# Patient Record
Sex: Female | Born: 1985 | Race: White | Hispanic: No | Marital: Single | Smoking: Current every day smoker
Health system: Southern US, Community
[De-identification: ages and names within clinical notes are randomized; demographics above are authoritative.]

## PROBLEM LIST (undated history)

## (undated) DIAGNOSIS — R87619 Unspecified abnormal cytological findings in specimens from cervix uteri: Secondary | ICD-10-CM

## (undated) HISTORY — PX: MOLE REMOVAL: SHX2046

## (undated) HISTORY — DX: Unspecified abnormal cytological findings in specimens from cervix uteri: R87.619

## (undated) HISTORY — PX: LEEP: SHX91

---
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
## 2003-05-17 ENCOUNTER — Other Ambulatory Visit: Admission: RE | Admit: 2003-05-17 | Discharge: 2003-05-17 | Payer: Self-pay | Admitting: Gynecology

## 2004-05-15 ENCOUNTER — Other Ambulatory Visit: Admission: RE | Admit: 2004-05-15 | Discharge: 2004-05-15 | Payer: Self-pay | Admitting: Gynecology

## 2005-06-04 ENCOUNTER — Other Ambulatory Visit: Admission: RE | Admit: 2005-06-04 | Discharge: 2005-06-04 | Payer: Self-pay | Admitting: Gynecology

## 2006-07-13 ENCOUNTER — Other Ambulatory Visit: Admission: RE | Admit: 2006-07-13 | Discharge: 2006-07-13 | Payer: Self-pay | Admitting: *Deleted

## 2007-02-02 ENCOUNTER — Other Ambulatory Visit: Admission: RE | Admit: 2007-02-02 | Discharge: 2007-02-02 | Payer: Self-pay | Admitting: *Deleted

## 2011-07-10 ENCOUNTER — Emergency Department (HOSPITAL_COMMUNITY): Payer: 59

## 2011-07-10 ENCOUNTER — Emergency Department (HOSPITAL_COMMUNITY)
Admission: EM | Admit: 2011-07-10 | Discharge: 2011-07-11 | Disposition: A | Discharge status: Home / Self Care | Payer: 59 | Attending: Emergency Medicine | Admitting: Emergency Medicine

## 2011-07-10 DIAGNOSIS — Z79899 Other long term (current) drug therapy: Secondary | ICD-10-CM | POA: Insufficient documentation

## 2011-07-10 DIAGNOSIS — N949 Unspecified condition associated with female genital organs and menstrual cycle: Secondary | ICD-10-CM | POA: Insufficient documentation

## 2011-07-10 DIAGNOSIS — Z87891 Personal history of nicotine dependence: Secondary | ICD-10-CM | POA: Insufficient documentation

## 2011-07-10 DIAGNOSIS — N12 Tubulo-interstitial nephritis, not specified as acute or chronic: Secondary | ICD-10-CM | POA: Insufficient documentation

## 2011-07-10 DIAGNOSIS — R110 Nausea: Secondary | ICD-10-CM | POA: Insufficient documentation

## 2011-07-10 DIAGNOSIS — D259 Leiomyoma of uterus, unspecified: Secondary | ICD-10-CM | POA: Insufficient documentation

## 2011-07-10 LAB — URINE MICROSCOPIC-ADD ON

## 2011-07-10 LAB — DIFFERENTIAL
Eosinophils Relative: 0 % (ref 0–5)
Lymphocytes Relative: 15 % (ref 12–46)
Lymphs Abs: 1.1 10*3/uL (ref 0.7–4.0)
Monocytes Absolute: 0.7 10*3/uL (ref 0.1–1.0)

## 2011-07-10 LAB — URINALYSIS, ROUTINE W REFLEX MICROSCOPIC
Ketones, ur: 15 mg/dL — AB
Nitrite: POSITIVE — AB
Protein, ur: NEGATIVE mg/dL

## 2011-07-10 LAB — CBC
HCT: 37.2 % (ref 36.0–46.0)
MCV: 94.9 fL (ref 78.0–100.0)
RDW: 11.3 % — ABNORMAL LOW (ref 11.5–15.5)
WBC: 7.4 10*3/uL (ref 4.0–10.5)

## 2011-07-10 LAB — WET PREP, GENITAL: Clue Cells Wet Prep HPF POC: NONE SEEN

## 2011-07-11 LAB — COMPREHENSIVE METABOLIC PANEL
ALT: 19 U/L (ref 0–35)
AST: 21 U/L (ref 0–37)
Albumin: 3.7 g/dL (ref 3.5–5.2)
Alkaline Phosphatase: 75 U/L (ref 39–117)
BUN: 8 mg/dL (ref 6–23)
Chloride: 101 mEq/L (ref 96–112)
Potassium: 3.2 mEq/L — ABNORMAL LOW (ref 3.5–5.1)
Sodium: 138 mEq/L (ref 135–145)
Total Bilirubin: 0.2 mg/dL — ABNORMAL LOW (ref 0.3–1.2)

## 2011-07-17 LAB — CULTURE, BLOOD (ROUTINE X 2)
Culture  Setup Time: 201209220529
Culture: NO GROWTH

## 2012-07-05 ENCOUNTER — Ambulatory Visit: Payer: Self-pay | Admitting: Internal Medicine

## 2012-07-05 ENCOUNTER — Telehealth: Payer: Self-pay

## 2012-07-05 VITALS — BP 120/76 | HR 84 | Temp 98.2°F | Resp 16 | Ht 66.5 in | Wt 133.0 lb

## 2012-07-05 DIAGNOSIS — Z23 Encounter for immunization: Secondary | ICD-10-CM

## 2012-07-05 NOTE — Telephone Encounter (Signed)
PATIENT HAD TDAP TODAY AND WANTED INFORMATION FAXED TO SCHOOL BUT SHE DIDN'T HAVE THE FAX #.  THAT # FOR [employer] IS [phone]  HER CELL PHONE IS [phone]

## 2012-07-05 NOTE — Progress Notes (Signed)
  Subjective:    Patient ID: Alicia Lucas, female    DOB: [birth year], 26 y.o.   MRN: [id number]  HPI Needs Tdap No other issues   Review of Systems     Objective:   Physical Exam healthy       Assessment & Plan:  Tdap

## 2012-07-05 NOTE — Telephone Encounter (Signed)
Called pt and informed that her T-dap immunization has been faxed and attn: to [facility].

## 2013-06-16 IMAGING — US US TRANSVAGINAL NON-OB
1 series · 13 of 25 positions shown · non-contrast
Comparison: None.

CLINICAL DATA: Dysuria

TRANSABDOMINAL AND TRANSVAGINAL ULTRASOUND OF PELVIS
TECHNIQUE: Both transabdominal and transvaginal ultrasound
examinations of the pelvis were performed.  Transabdominal
technique was performed for global imaging of the pelvis including
uterus, ovaries, adnexal regions, and pelvic cul-de-sac.
It was necessary to proceed with endovaginal exam following the
transabdominal exam to visualize the uterus, endometrium, ovaries
and adnexa.

[Series 1: us transvaginal non-ob · 0.23mm/px · 13 of 40 slices shown]
[im 1/40]
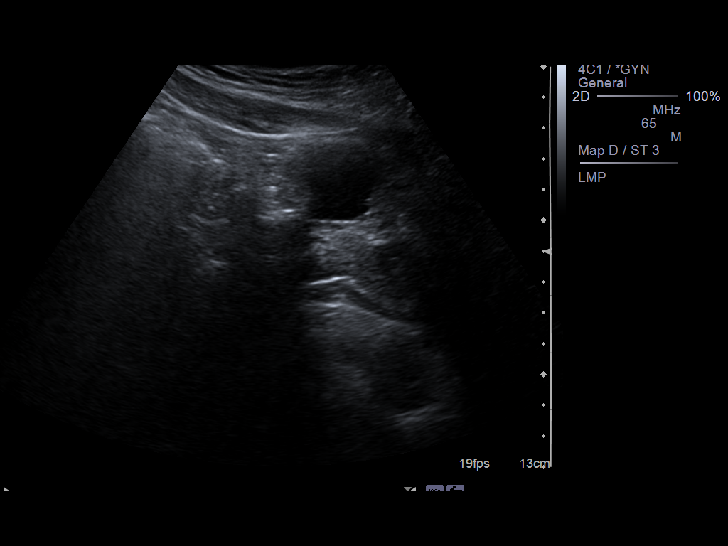
[im 4/40]
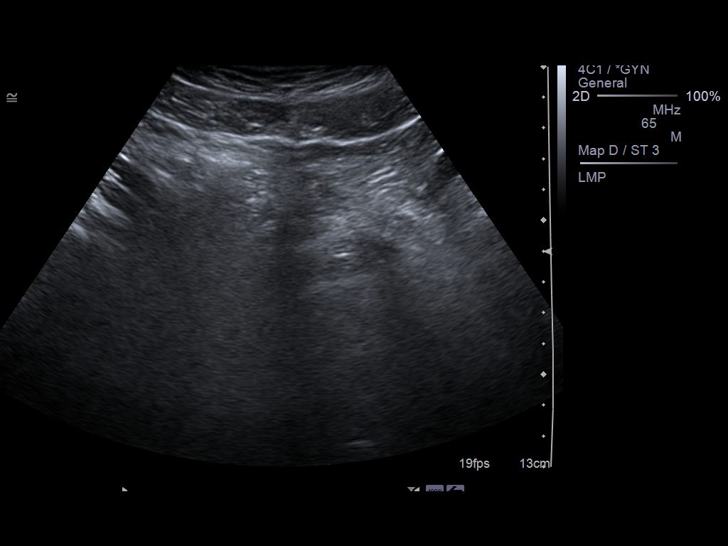
[im 7/40]
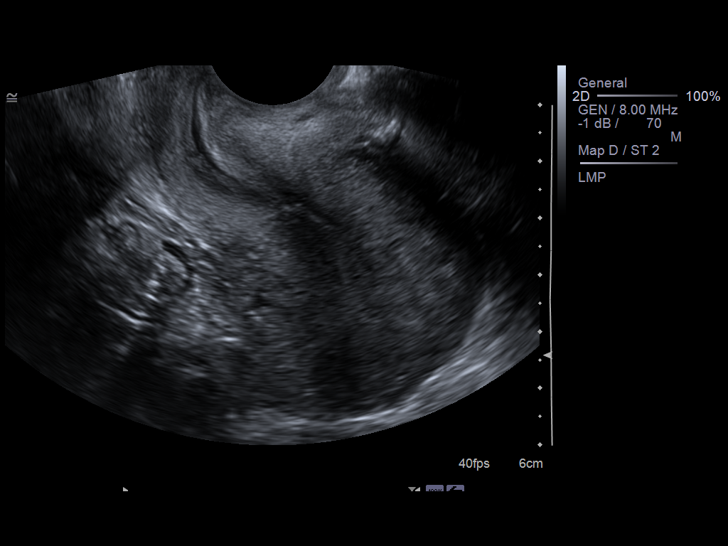
[im 10/40]
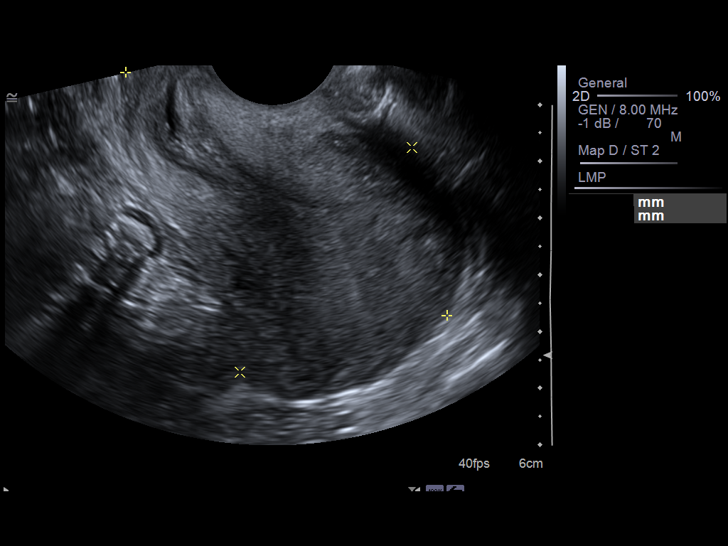
[im 14/40]
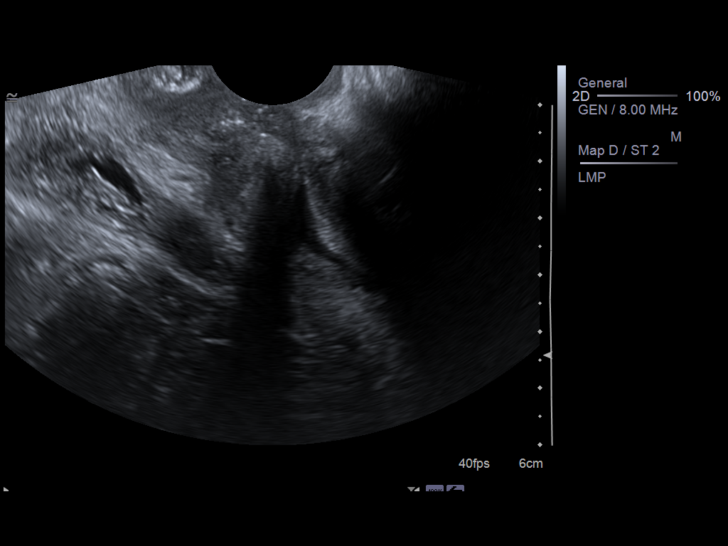
[im 17/40]
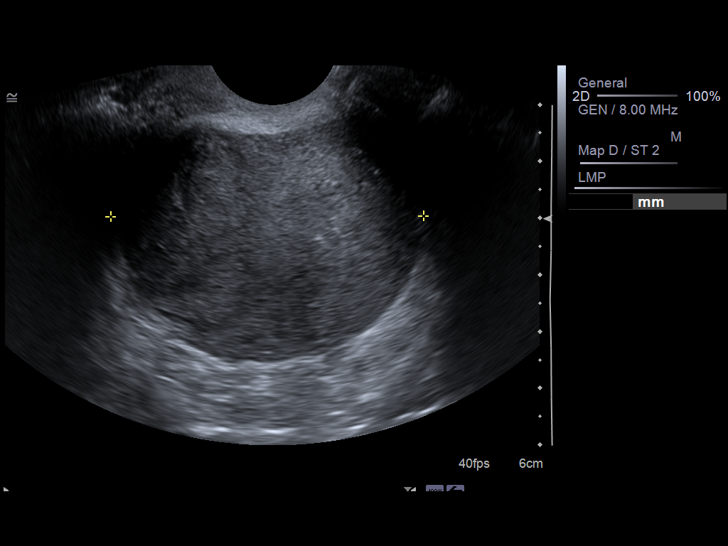
[im 20/40]
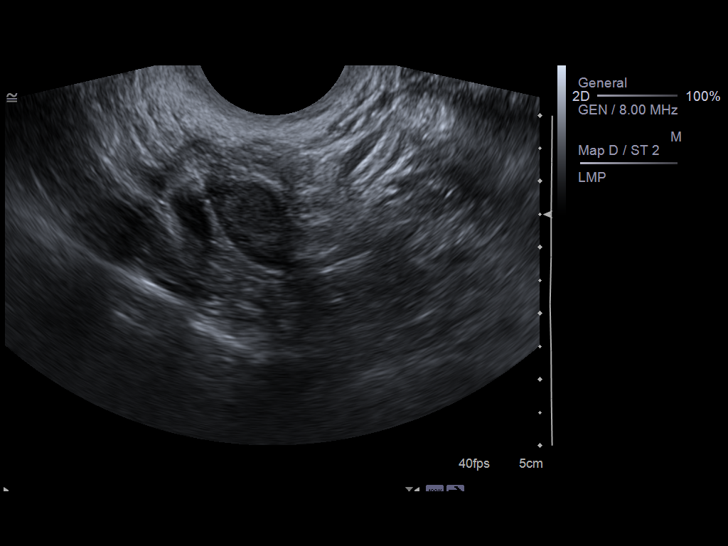
[im 23/40]
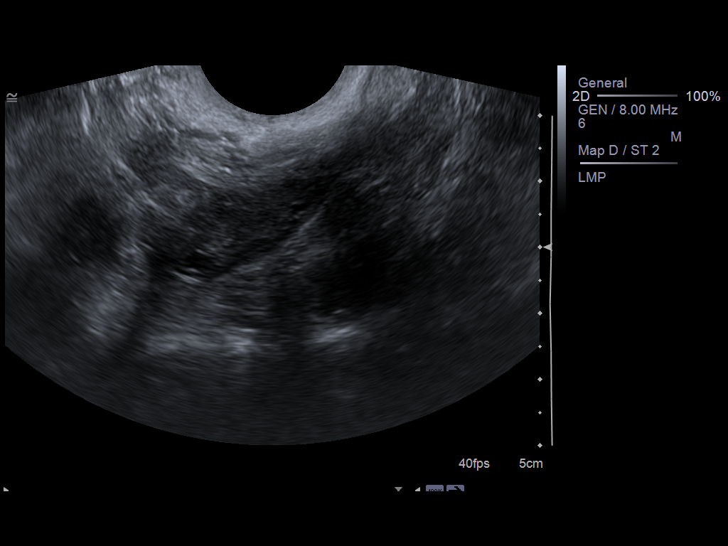
[im 27/40]
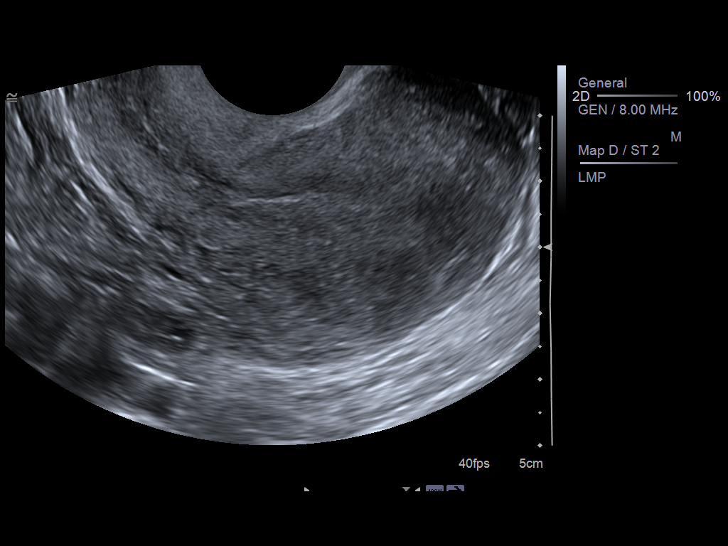
[im 30/40]
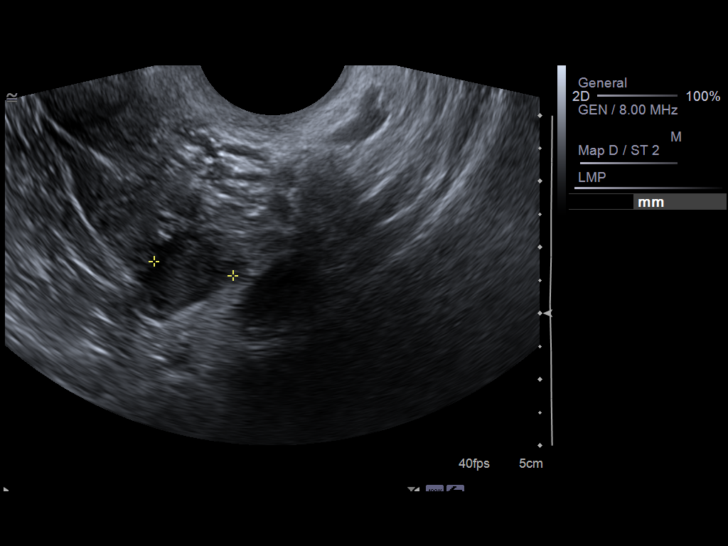
[im 33/40]
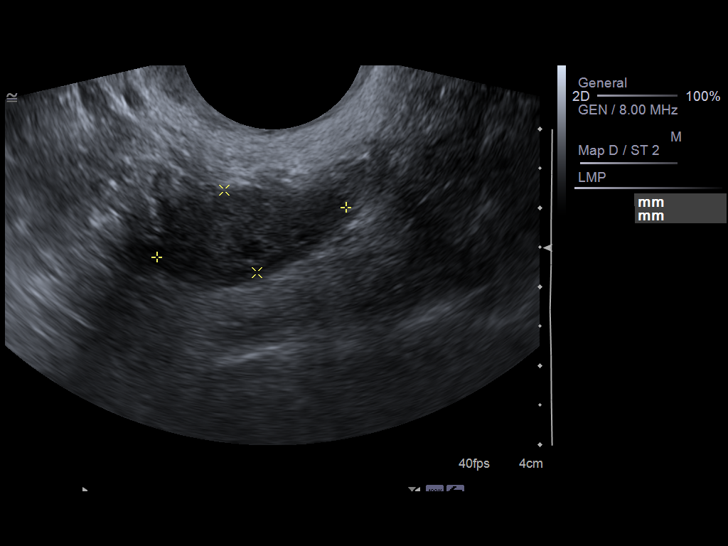
[im 36/40]
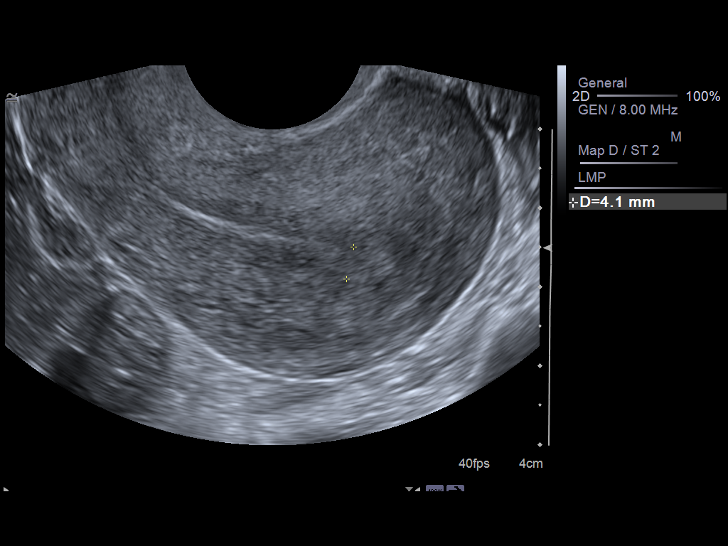
[im 40/40]
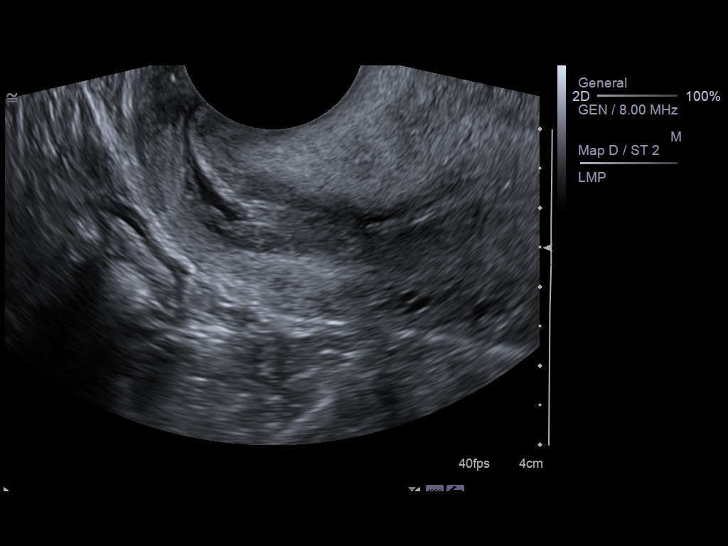

[13 of 25 positions shown; findings below may reference images not displayed]

FINDINGS: Uterus:  The uterus measures 7.1 x 5.0 x 5.5 cm.  Retroflexed in
orientation containing fluid within the lower uterine segment and
cervix.  Within the posterior myometrium there is a fibroid
measuring 6.1 x 5.4 x 6.1 mm. Within the lower uterine segment
there is a focal ovoid filling defect which measures 6.8 x 1.1 x
5.8 mm.

Endometrium: Normal in thickness and appearance measuring up to
mm.

Right ovary: Right ovary appears normal measuring 3.0 x 1.2 x
cm.

Left ovary: The left ovary is normal measuring 2.5 x 1.1 x 1.4 cm.

Other Findings:  No free fluid
IMPRESSION: 1.  Focal filling defect within the lower uterine segment.  This
may represent debris and/or blood clot. Alternatively this could
represent a small submucosal fibroid or polyp.  Recommend follow-up
pelvic sonogram within 6-12 weeks and to ensure resolution.  If
this persists then further evaluation with sonohystogram is
recommended.
2.  Small posterior fibroid.

## 2014-04-15 ENCOUNTER — Ambulatory Visit (INDEPENDENT_AMBULATORY_CARE_PROVIDER_SITE_OTHER): Payer: BC Managed Care – PPO | Admitting: Emergency Medicine

## 2014-04-15 VITALS — BP 100/76 | HR 100 | Temp 98.0°F | Resp 20 | Ht 66.5 in | Wt 138.2 lb

## 2014-04-15 DIAGNOSIS — A09 Infectious gastroenteritis and colitis, unspecified: Secondary | ICD-10-CM

## 2014-04-15 DIAGNOSIS — R197 Diarrhea, unspecified: Secondary | ICD-10-CM

## 2014-04-15 MED ORDER — ONDANSETRON 8 MG PO TBDP: 8.0000 mg | ORAL_TABLET | Freq: Four times a day (QID) | ORAL | Status: DC | PRN

## 2014-04-15 MED ORDER — CIPROFLOXACIN HCL 500 MG PO TABS: 500.0000 mg | ORAL_TABLET | Freq: Two times a day (BID) | ORAL | Status: DC

## 2014-04-15 MED ORDER — LOPERAMIDE HCL 2 MG PO TABS: ORAL_TABLET | ORAL | Status: DC

## 2014-04-15 NOTE — Progress Notes (Signed)
[facility] [address] [place] [address] [phone]  Date:  04/15/2014   Name:  Alicia Lucas   DOB:  05/12/1986   MRN:  [id number]  PCP:  No PCP Per Patient    Chief Complaint: Abdominal Pain   History of Present Illness:  Sintia Mckissic is a 28 y.o. very pleasant female patient who presents with the following:  Ill after returning last night from [place] on vacation.  Had profuse watery diarrhea and abdominal cramping pain all night long.  No fever but chilled.  Nauseated and scattered vomiting.  No ill contacts.  The patient has no complaint of blood, mucous, or pus in her stools. No improvement with over the counter medications or other home remedies. Denies other complaint or health concern today.   There are no active problems to display for this patient.   No past medical history on file.  No past surgical history on file.  History  Substance Use Topics  . Smoking status: Current Some Day Smoker -- 0.50 packs/day    Types: Cigarettes  . Smokeless tobacco: Not on file  . Alcohol Use: Yes     Comment: social use    No family history on file.  No Known Allergies  Medication list has been reviewed and updated.  No current outpatient prescriptions on file prior to visit.   No current facility-administered medications on file prior to visit.    Review of Systems:  As per HPI, otherwise negative.    Physical Examination: Filed Vitals:   04/15/14 1102  BP: 100/76  Pulse: 100  Temp: 98 F (36.7 C)  Resp: 20   Filed Vitals:   04/15/14 1102  Height: 5' 6.5" (1.689 m)  Weight: 138 lb 3.2 oz (62.687 kg)   Body mass index is 21.97 kg/(m^2). Ideal Body Weight: Weight in (lb) to have BMI = 25: 156.9  GEN: WDWN, NAD, Non-toxic, A & O x 3 HEENT: Atraumatic, Normocephalic. Neck supple. No masses, No LAD.  Tongue dry Ears and Nose: No external deformity. CV: RRR, No M/G/R. No JVD. No thrill. No extra heart sounds. PULM: CTA  B, no wheezes, crackles, rhonchi. No retractions. No resp. distress. No accessory muscle use. ABD: S, left abdominal tenderness, ND, +BS. No rebound. No HSM. EXTR: No c/c/e NEURO Normal gait.  PSYCH: Normally interactive. Conversant. Not depressed or anxious appearing.  Calm demeanor.    Assessment and Plan: travellers diarrhea cipro immodium zofran   Signed,  Ellison Carwin, MD

## 2014-04-15 NOTE — Patient Instructions (Signed)

## 2016-01-17 ENCOUNTER — Ambulatory Visit (INDEPENDENT_AMBULATORY_CARE_PROVIDER_SITE_OTHER): Payer: BLUE CROSS/BLUE SHIELD | Admitting: Podiatry

## 2016-01-17 ENCOUNTER — Encounter: Payer: Self-pay | Admitting: Podiatry

## 2016-01-17 VITALS — BP 131/72 | HR 75 | Resp 12

## 2016-01-17 DIAGNOSIS — B351 Tinea unguium: Secondary | ICD-10-CM | POA: Diagnosis not present

## 2016-01-17 LAB — CBC WITH DIFFERENTIAL/PLATELET
BASOS ABS: 0.1 10*3/uL (ref 0.0–0.1)
Basophils Relative: 1 % (ref 0–1)
EOS PCT: 4 % (ref 0–5)
Eosinophils Absolute: 0.2 10*3/uL (ref 0.0–0.7)
HCT: 40.1 % (ref 36.0–46.0)
HEMOGLOBIN: 13.3 g/dL (ref 12.0–15.0)
LYMPHS ABS: 1.4 10*3/uL (ref 0.7–4.0)
LYMPHS PCT: 27 % (ref 12–46)
MCH: 32.6 pg (ref 26.0–34.0)
MCHC: 33.2 g/dL (ref 30.0–36.0)
MCV: 98.3 fL (ref 78.0–100.0)
MONO ABS: 0.7 10*3/uL (ref 0.1–1.0)
MPV: 9.6 fL (ref 8.6–12.4)
Monocytes Relative: 14 % — ABNORMAL HIGH (ref 3–12)
NEUTROS ABS: 2.8 10*3/uL (ref 1.7–7.7)
Neutrophils Relative %: 54 % (ref 43–77)
PLATELETS: 237 10*3/uL (ref 150–400)
RBC: 4.08 MIL/uL (ref 3.87–5.11)
RDW: 12.4 % (ref 11.5–15.5)
WBC: 5.2 10*3/uL (ref 4.0–10.5)

## 2016-01-17 LAB — HEPATIC FUNCTION PANEL
ALT: 10 U/L (ref 6–29)
AST: 13 U/L (ref 10–30)
Albumin: 3.8 g/dL (ref 3.6–5.1)
Alkaline Phosphatase: 55 U/L (ref 33–115)
BILIRUBIN DIRECT: 0.1 mg/dL (ref <=0.2)
BILIRUBIN INDIRECT: 0.2 mg/dL (ref 0.2–1.2)
BILIRUBIN TOTAL: 0.3 mg/dL (ref 0.2–1.2)
Total Protein: 6.7 g/dL (ref 6.1–8.1)

## 2016-01-17 MED ORDER — TERBINAFINE HCL 250 MG PO TABS: 250.0000 mg | ORAL_TABLET | Freq: Every day | ORAL | Status: DC

## 2016-01-17 NOTE — Progress Notes (Signed)
Subjective:     Patient ID: Alicia Lucas, female   DOB: 27-Jan-1986, 30 y.o.   MRN: [id number]  HPI patient presents stating all my nails have become discolored and I'm very worried about fungus   Review of Systems  All other systems reviewed and are negative.      Objective:   Physical Exam  Constitutional: She is oriented to person, place, and time.  Cardiovascular: Intact distal pulses.   Musculoskeletal: Normal range of motion.  Neurological: She is oriented to person, place, and time.  Skin: Skin is warm.  Nursing note and vitals reviewed.  neurovascular status intact muscle strength adequate range of motion within normal limits with patient found to have discoloration of all nailbeds 1 through 5 of the distal two thirds of the nail with no skin manifestations. 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 She is quite anxious for treatment and we are to start on antifungal and I did order liver function and CBC with differential today. I also sent culture to evaluate fungus and we'll look at results and decide if anything else is appropriate placed on Lamisil 250 mg daily for 90 days and reappoint 2 months

## 2016-01-17 NOTE — Progress Notes (Signed)
   Subjective:    Patient ID: Alicia Lucas, female    DOB: 12-Apr-1986, 30 y.o.   MRN: [id number]  HPI  The patient presents here today with B/L toenails that are discolored and yellow since 3-4 months ago with no improvements.   Review of Systems  Constitutional: Positive for fatigue.  Cardiovascular: Positive for palpitations.  Neurological: Positive for dizziness.  Hematological:       Slow to heal       Objective:   Physical Exam        Assessment & Plan:

## 2016-01-20 NOTE — Addendum Note (Signed)
Addended by: Harriett Sine D on: 01/20/2016 08:31 AM   Modules accepted: Orders

## 2016-01-23 ENCOUNTER — Telehealth: Payer: Self-pay | Admitting: *Deleted

## 2016-01-23 NOTE — Telephone Encounter (Signed)
Dr. Paulla Dolly reviewed pt's blood work of 01/17/2016 as normal.  Left message informing pt.

## 2016-01-24 ENCOUNTER — Telehealth: Payer: Self-pay | Admitting: *Deleted

## 2016-01-24 DIAGNOSIS — Z79899 Other long term (current) drug therapy: Secondary | ICD-10-CM

## 2016-01-24 MED ORDER — NONFORMULARY OR COMPOUNDED ITEM: Status: DC

## 2016-01-24 NOTE — Telephone Encounter (Addendum)
Pt called for lab results. I told pt the blood work was normal and she could take her medication as directed.  Pt states she has a history of liver cancer in her family, and pt states she was told she would also get a topical but it was not at the pharmacy.  I told pt I would let Dr. Paulla Dolly know her concerns about the cancer and I would order a topical nail lacquer from [facility] in [place] and they would contact her. Left message on pt's mobile that Dr. Paulla Dolly ordered Hepatic function labs for after 30 doses of the lamisil, and that I had mailed her a copy of the labs as a reminder.

## 2016-03-10 ENCOUNTER — Encounter: Payer: Self-pay | Admitting: Podiatry

## 2016-03-18 ENCOUNTER — Ambulatory Visit: Payer: BLUE CROSS/BLUE SHIELD | Admitting: Podiatry

## 2016-11-02 ENCOUNTER — Telehealth: Payer: Self-pay | Admitting: Podiatry

## 2016-11-02 NOTE — Telephone Encounter (Signed)
Pt called reguarding a bill she got from [facility] from 3.31.17. She called them first and they said our office could possibly help pt get it taken care of. She said she did not request a biopsy and did not know why it was done.  She would like a call back please

## 2016-11-03 NOTE — Telephone Encounter (Signed)
Pt states she was unaware of the biopsy and did not ask for it. I told pt that it was how the doctor determined if there was fungus and the type to determine the treatment. Pt states she was given the treatment without the results and she was wondering why such a test was not covered by insurance and she can't afford to pay over $600.00. Pt states the people at the lab said to discuss with the doctor's office.

## 2017-03-24 ENCOUNTER — Encounter: Payer: Self-pay | Admitting: Family Medicine

## 2017-03-24 ENCOUNTER — Ambulatory Visit: Payer: Self-pay | Admitting: Family Medicine

## 2017-03-24 VITALS — BP 108/83 | HR 96 | Ht 67.0 in | Wt 136.0 lb

## 2017-03-24 DIAGNOSIS — Z716 Tobacco abuse counseling: Secondary | ICD-10-CM

## 2017-03-24 DIAGNOSIS — R87619 Unspecified abnormal cytological findings in specimens from cervix uteri: Secondary | ICD-10-CM

## 2017-03-24 DIAGNOSIS — Z720 Tobacco use: Secondary | ICD-10-CM

## 2017-03-24 DIAGNOSIS — Z7189 Other specified counseling: Secondary | ICD-10-CM

## 2017-03-24 DIAGNOSIS — R5383 Other fatigue: Secondary | ICD-10-CM

## 2017-03-24 NOTE — Progress Notes (Signed)
New patient office visit note:  Impression and Recommendations:    1. Tobacco abuse   2. Tobacco abuse counseling   3. Counseling on health promotion and disease prevention   4. Abnormal cervical Papanicolaou smear, unspecified abnormal pap finding      No problem-specific Assessment & Plan notes found for this encounter.   The patient was counseled, risk factors were discussed, anticipatory guidance given.   No orders of the defined types were placed in this encounter.    Discontinued Medications   NONFORMULARY OR COMPOUNDED [place] compound:  Onychomycosis Nail Lacquer - Fluconazole 2%, Terbinafine 1%, DMSO, dispense 120grams, apply to affected area daily, +2refills.   TERBINAFINE (LAMISIL) 250 MG TABLET    Take 1 tablet (250 mg total) by mouth daily.      No orders of the defined types were placed in this encounter.    Gross side effects, risk and benefits, and alternatives of medications discussed with patient.  Patient is aware that all medications have potential side effects and we are unable to predict every side effect or drug-drug interaction that may occur.  Expresses verbal understanding and consents to current therapy plan and treatment regimen.  No Follow-up on file.  Please see AVS handed out to patient at the end of our visit for further patient instructions/ counseling done pertaining to today's office visit.    Note: This document was prepared using Dragon voice recognition software and may include unintentional dictation errors.  ----------------------------------------------------------------------------------------------------------------------    Subjective:    Chief complaint:   Chief Complaint  Patient presents with  . Establish Care     HPI: Alicia Lucas is a pleasant 31 y.o. female who presents to [place] at [facility] today to review their medical history with me and establish care.   I asked  the patient to review their chronic problem list with me to ensure everything was updated and accurate.    All recent office visits with other providers, any medical records that patient brought in etc  - I reviewed today.     Also asked pt to get me medical records from [facility] providers/ specialists that they had seen within the past 3-5 years- if they are in private practice and/or do not work for a [employer], [facility], [facility], [facility] or [employer] owned practice.  Told them to call their specialists to clarify this if they are not sure.   PCP- dr Ardeth Perfect-  Saw her twice.  Came here based on recs.  tob :  1/2 ppd * 10 yrs.  Only when drinking.    difficulty with sleep for many yrs:   10 + yrs.  trouble falling and staying asleep.  Several awakenings per night- starts looking at phone.   Prior PCP had her on some sleeping pills- not sure what/ name.  No snoring.  No thrashing dreams etc.     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    H/o taking anti-depressants:  More anxiety than depression lately:  On meds in early 20's.  For depression - was on it short period and then stopped on her own.    Family members encourage her health actions.   Scared feeling and feels overwhlemed  Lack of energy:   Feels exhausted after work and tired entire day-  feels like wants to lay down.     Last time she exercised  was 3 wks ago-  Did some wt lifting on circuits, never does cardio.   Diet:    Normally eats well.   Skips breakfast "but takes her vitamins."   Hasn't eaten today at all yet- its 4:30 pm.  Meats, veggies, potatoes etc.      Constipation:  Chronic, 1 -2 cups water per day.  Drinks a coffee, a cup of water and a beer at night or little water.   A lot tearing- always had good vision, but last time checked HS.             Wt Readings from Last 3 Encounters:  03/24/17 136 lb (61.7 kg)  04/15/14 138 lb 3.2 oz (62.7 kg)  07/05/12 133 lb (60.3 kg)   BP Readings from Last 3  Encounters:  03/24/17 108/83  01/17/16 131/72  04/15/14 100/76   Pulse Readings from Last 3 Encounters:  03/24/17 96  01/17/16 75  04/15/14 100   BMI Readings from Last 3 Encounters:  03/24/17 21.30 kg/m  04/15/14 21.97 kg/m  07/05/12 21.15 kg/m    Patient Care Team    Relationship Specialty Notifications Start End  Velna Hatchet, MD PCP - General Internal Medicine  01/08/16   Key, Nelia Shi, NP Nurse Practitioner Gynecology  03/24/17   Allyn Kenner, MD Consulting Physician Dermatology  03/24/17    Comment: benign skin lesion removal    Patient Active Problem List   Diagnosis Date Noted  . Tobacco abuse 03/24/2017  . Tobacco abuse counseling 03/24/2017  . Abnormal cervical Papanicolaou smear 03/24/2017     Past Medical History:  Diagnosis Date  . Abnormal Pap smear of cervix      Past Medical History:  Diagnosis Date  . Abnormal Pap smear of cervix      Past Surgical History:  Procedure Laterality Date  . LEEP    . MOLE REMOVAL       No family history on file.   History  Drug Use No     History  Alcohol Use  . Yes    Comment: social use     History  Smoking Status  . Current Every Day Smoker  . Packs/day: 0.50  . Types: Cigarettes  Smokeless Tobacco  . Never Used     Outpatient Encounter Prescriptions as of 03/24/2017  Medication Sig Note  . JUNEL FE 1/20 1-20 MG-MCG tablet Take 1 tablet by mouth daily. 01/17/2016: Received from: External Pharmacy  . [DISCONTINUED] NONFORMULARY OR COMPOUNDED [place] compound:  Onychomycosis Nail Lacquer - Fluconazole 2%, Terbinafine 1%, DMSO, dispense 120grams, apply to affected area daily, +2refills.   . [DISCONTINUED] terbinafine (LAMISIL) 250 MG tablet Take 1 tablet (250 mg total) by mouth daily.    No facility-administered encounter medications on file as of 03/24/2017.     Allergies: Seasonal ic [cholestatin]   ROS   Objective:   Blood pressure 108/83, pulse 96, height 5\' 7"   (1.702 m), weight 136 lb (61.7 kg), last menstrual period 03/15/2017. Body mass index is 21.3 kg/m. General: Well Developed, well nourished, and in no acute distress.  Neuro: Alert and oriented x3, extra-ocular muscles intact, sensation grossly intact.  HEENT:[place]/AT, PERRLA, neck supple, No carotid bruits Skin: no gross rashes  Cardiac: Regular rate and rhythm Respiratory: Essentially clear to auscultation bilaterally. Not using accessory muscles, speaking in full sentences.  Abdominal: not grossly distended Musculoskeletal: Ambulates w/o diff, FROM * 4 ext.  Vasc: less 2 sec cap RF, warm and pink  Psych:  No HI/SI, judgement and insight good, Euthymic mood. Full Affect.    No results found for this or any previous visit (from the past 2160 hour(s)).

## 2017-03-24 NOTE — Patient Instructions (Addendum)
5 bottle water per day.    For 1 week to try to focus on a new tasks such as drinking more water, then the next week try to focus on sleep hygiene, then the next week and will go.   21 days to create a GOOD habit.    If you have insomnia or difficulty sleeping, this information is for you:  Melatonin 10mg  nitely.   - Avoid caffeinated beverages after lunch,  no alcoholic beverages,  no eating within 2-3 hours of lying down,  avoid exposure to blue light before bed,  avoid daytime naps, and  needs to maintain a regular sleep schedule- go to sleep and wake up around the same time every night.  You can use special computer\ blue light blocking glasses if it's a must for you to look at the computer or TV late into the evening.  - Resolve concerns or worries before entering bedroom:  Discussed relaxation techniques with patient and to keep a journal to write down fears\ worries.  I suggested seeing a counselor for CBT.   - Recommend patient meditate or do deep breathing exercises to help relax.   Incorporate the use of white noise machines or listen to "sleep meditation music", or recordings of guided meditations for sleep from YouTube which are free, such as  "guided meditation for detachment from over thinking"  by Mayford Knife.    8 hour deep sleep music: [place] waves, relaxing music by [employer].  Biurinal beats.     Please realize, EXERCISE IS MEDICINE!  -  American Heart Association [facility]) guidelines for exercise : If you are in good health, without any medical conditions, you should engage in 150 minutes of moderate intensity aerobic activity per week.  This means you should be huffing and puffing throughout your workout.   Engaging in regular exercise will improve brain function and memory, as well as improve mood, boost immune system and help with weight management.  As well as the other, more well-known effects of exercise such as decreasing blood sugar levels, decreasing blood  pressure,  and decreasing bad cholesterol levels/ increasing good cholesterol levels.     -  The AHA strongly endorses consumption of a diet that contains a variety of foods from all the food categories with an emphasis on fruits and vegetables; fat-free and low-fat dairy products; cereal and grain products; legumes and nuts; and fish, poultry, and/or extra lean meats.    Excessive food intake, especially of foods high in saturated and trans fats, sugar, and salt, should be avoided.    Adequate water intake of roughly 1/2 of your weight in pounds, should equal the ounces of water per day you should drink.  So for instance, if you're 200 pounds, that would be 100 ounces of water per day.         Mediterranean Diet  Why follow it? Research shows. . Those who follow the Mediterranean diet have a reduced risk of heart disease  . The diet is associated with a reduced incidence of Parkinson's and Alzheimer's diseases . People following the diet may have longer life expectancies and lower rates of chronic diseases  . The Dietary Guidelines for Americans recommends the Mediterranean diet as an eating plan to promote health and prevent disease  What Is the Mediterranean Diet?  . Healthy eating plan based on typical foods and recipes of Mediterranean-style cooking . The diet is primarily a plant based diet; these foods should make up a majority  of meals   Starches - Plant based foods should make up a majority of meals - They are an important sources of vitamins, minerals, energy, antioxidants, and fiber - Choose whole grains, foods high in fiber and minimally processed items  - Typical grain sources include wheat, oats, barley, corn, brown rice, bulgar, farro, millet, polenta, couscous  - Various types of beans include chickpeas, lentils, fava beans, black beans, white beans   Fruits  Veggies - Large quantities of antioxidant rich fruits & veggies; 6 or more servings  - Vegetables can be eaten raw or  lightly drizzled with oil and cooked  - Vegetables common to the traditional Mediterranean Diet include: artichokes, arugula, beets, broccoli, brussel sprouts, cabbage, carrots, celery, collard greens, cucumbers, eggplant, kale, leeks, lemons, lettuce, mushrooms, okra, onions, peas, peppers, potatoes, pumpkin, radishes, rutabaga, shallots, spinach, sweet potatoes, turnips, zucchini - Fruits common to the Mediterranean Diet include: apples, apricots, avocados, cherries, clementines, dates, figs, grapefruits, grapes, melons, nectarines, oranges, peaches, pears, pomegranates, strawberries, tangerines  Fats - Replace butter and margarine with healthy oils, such as olive oil, canola oil, and tahini  - Limit nuts to no more than a handful a day  - Nuts include walnuts, almonds, pecans, pistachios, pine nuts  - Limit or avoid candied, honey roasted or heavily salted nuts - Olives are central to the [employer] - can be eaten whole or used in a variety of dishes   Meats Protein - Limiting red meat: no more than a few times a month - When eating red meat: choose lean cuts and keep the portion to the size of deck of cards - Eggs: approx. 0 to 4 times a week  - Fish and lean poultry: at least 2 a week  - Healthy protein sources include, chicken, [place], lean beef, lamb - Increase intake of seafood such as tuna, salmon, trout, mackerel, shrimp, scallops - Avoid or limit high fat processed meats such as sausage and bacon  Dairy - Include moderate amounts of low fat dairy products  - Focus on healthy dairy such as fat free yogurt, skim milk, low or reduced fat cheese - Limit dairy products higher in fat such as whole or 2% milk, cheese, ice cream  Alcohol - Moderate amounts of red wine is ok  - No more than 5 oz daily for women (all ages) and men older than age 48  - No more than 10 oz of wine daily for men younger than 27  Other - Limit sweets and other desserts  - Use herbs and spices instead of  salt to flavor foods  - Herbs and spices common to the traditional Mediterranean Diet include: basil, bay leaves, chives, cloves, cumin, fennel, garlic, lavender, marjoram, mint, oregano, parsley, pepper, rosemary, sage, savory, sumac, tarragon, thyme   It's not just a diet, it's a lifestyle:  . The Mediterranean diet includes lifestyle factors typical of those in the region  . Foods, drinks and meals are best eaten with others and savored . Daily physical activity is important for overall good health . This could be strenuous exercise like running and aerobics . This could also be more leisurely activities such as walking, housework, yard-work, or taking the stairs . Moderation is the key; a balanced and healthy diet accommodates most foods and drinks . Consider portion sizes and frequency of consumption of certain foods   Meal Ideas & Options:  . Breakfast:  o Whole wheat toast or whole wheat English muffins with peanut butter &  hard boiled egg o Steel cut oats topped with apples & cinnamon and skim milk  o Fresh fruit: banana, strawberries, melon, berries, peaches  o Smoothies: strawberries, bananas, greek yogurt, peanut butter o Low fat greek yogurt with blueberries and granola  o Egg white omelet with spinach and mushrooms o Breakfast couscous: whole wheat couscous, apricots, skim milk, cranberries  . Sandwiches:  o Hummus and grilled vegetables (peppers, zucchini, squash) on whole wheat bread   o Grilled chicken on whole wheat pita with lettuce, tomatoes, cucumbers or tzatziki  o Tuna salad on whole wheat bread: tuna salad made with greek yogurt, olives, red peppers, capers, green onions o Garlic rosemary lamb pita: lamb sauted with garlic, rosemary, salt & pepper; add lettuce, cucumber, greek yogurt to pita - flavor with lemon juice and black pepper  . Seafood:  o Mediterranean grilled salmon, seasoned with garlic, basil, parsley, lemon juice and black pepper o Shrimp, lemon, and  spinach whole-grain pasta salad made with low fat greek yogurt  o Seared scallops with lemon orzo  o Seared tuna steaks seasoned salt, pepper, coriander topped with tomato mixture of olives, tomatoes, olive oil, minced garlic, parsley, green onions and cappers  . Meats:  o Herbed greek chicken salad with kalamata olives, cucumber, feta  o Red bell peppers stuffed with spinach, bulgur, lean ground beef (or lentils) & topped with feta   o Kebabs: skewers of chicken, tomatoes, onions, zucchini, squash  o [place] burgers: made with red onions, mint, dill, lemon juice, feta cheese topped with roasted red peppers . Vegetarian o Cucumber salad: cucumbers, artichoke hearts, celery, red onion, feta cheese, tossed in olive oil & lemon juice  o Hummus and whole grain pita points with a greek salad (lettuce, tomato, feta, olives, cucumbers, red onion) o Lentil soup with celery, carrots made with vegetable broth, garlic, salt and pepper  o Tabouli salad: parsley, bulgur, mint, scallions, cucumbers, tomato, radishes, lemon juice, olive oil, salt and pepper.

## 2017-05-24 ENCOUNTER — Ambulatory Visit: Payer: Self-pay | Admitting: Family Medicine

## 2017-06-23 ENCOUNTER — Telehealth: Payer: Self-pay | Admitting: Family Medicine

## 2017-06-23 NOTE — Telephone Encounter (Signed)
Per patient request mailed a list of therapist to the patient.  MPulliam, CMA/RT(R)

## 2017-06-23 NOTE — Telephone Encounter (Signed)
Pt called requested to speak w/ Dr. Raliegh Scarlet regarding advise on who to choose for a psych provider-- Pls cll patient on cell# [phone] as soon as possible. --glh

## 2018-02-03 DIAGNOSIS — Z3041 Encounter for surveillance of contraceptive pills: Secondary | ICD-10-CM | POA: Diagnosis not present

## 2018-02-03 DIAGNOSIS — Z124 Encounter for screening for malignant neoplasm of cervix: Secondary | ICD-10-CM | POA: Diagnosis not present

## 2018-02-03 DIAGNOSIS — Z130 Encounter for screening for diseases of the blood and blood-forming organs and certain disorders involving the immune mechanism: Secondary | ICD-10-CM | POA: Diagnosis not present

## 2018-02-03 DIAGNOSIS — Z01419 Encounter for gynecological examination (general) (routine) without abnormal findings: Secondary | ICD-10-CM | POA: Diagnosis not present

## 2018-02-03 DIAGNOSIS — Z1389 Encounter for screening for other disorder: Secondary | ICD-10-CM | POA: Diagnosis not present

## 2018-02-03 DIAGNOSIS — Z1151 Encounter for screening for human papillomavirus (HPV): Secondary | ICD-10-CM | POA: Diagnosis not present

## 2018-02-03 DIAGNOSIS — D509 Iron deficiency anemia, unspecified: Secondary | ICD-10-CM | POA: Diagnosis not present

## 2018-02-03 DIAGNOSIS — Z6822 Body mass index (BMI) 22.0-22.9, adult: Secondary | ICD-10-CM | POA: Diagnosis not present

## 2018-12-09 DIAGNOSIS — R319 Hematuria, unspecified: Secondary | ICD-10-CM | POA: Diagnosis not present

## 2018-12-09 DIAGNOSIS — R309 Painful micturition, unspecified: Secondary | ICD-10-CM | POA: Diagnosis not present

## 2018-12-27 DIAGNOSIS — N898 Other specified noninflammatory disorders of vagina: Secondary | ICD-10-CM | POA: Diagnosis not present

## 2018-12-27 DIAGNOSIS — N760 Acute vaginitis: Secondary | ICD-10-CM | POA: Diagnosis not present

## 2018-12-27 DIAGNOSIS — Z113 Encounter for screening for infections with a predominantly sexual mode of transmission: Secondary | ICD-10-CM | POA: Diagnosis not present

## 2019-03-05 DIAGNOSIS — R1084 Generalized abdominal pain: Secondary | ICD-10-CM | POA: Diagnosis not present

## 2019-03-05 DIAGNOSIS — R1030 Lower abdominal pain, unspecified: Secondary | ICD-10-CM | POA: Diagnosis not present

## 2019-03-05 DIAGNOSIS — N3001 Acute cystitis with hematuria: Secondary | ICD-10-CM | POA: Diagnosis not present

## 2019-03-06 DIAGNOSIS — N390 Urinary tract infection, site not specified: Secondary | ICD-10-CM | POA: Diagnosis not present

## 2019-03-06 DIAGNOSIS — K5900 Constipation, unspecified: Secondary | ICD-10-CM | POA: Diagnosis not present

## 2019-03-06 DIAGNOSIS — K921 Melena: Secondary | ICD-10-CM | POA: Diagnosis not present

## 2019-03-06 DIAGNOSIS — R300 Dysuria: Secondary | ICD-10-CM | POA: Diagnosis not present

## 2019-03-07 ENCOUNTER — Emergency Department (HOSPITAL_COMMUNITY)
Admission: EM | Admit: 2019-03-07 | Discharge: 2019-03-07 | Disposition: A | Discharge status: Home / Self Care | Payer: BLUE CROSS/BLUE SHIELD | Attending: Emergency Medicine | Admitting: Emergency Medicine

## 2019-03-07 ENCOUNTER — Encounter (HOSPITAL_COMMUNITY): Payer: Self-pay | Admitting: Emergency Medicine

## 2019-03-07 ENCOUNTER — Emergency Department (HOSPITAL_COMMUNITY): Payer: BLUE CROSS/BLUE SHIELD

## 2019-03-07 ENCOUNTER — Other Ambulatory Visit: Payer: Self-pay

## 2019-03-07 DIAGNOSIS — R52 Pain, unspecified: Secondary | ICD-10-CM | POA: Diagnosis not present

## 2019-03-07 DIAGNOSIS — D259 Leiomyoma of uterus, unspecified: Secondary | ICD-10-CM | POA: Diagnosis not present

## 2019-03-07 DIAGNOSIS — N730 Acute parametritis and pelvic cellulitis: Secondary | ICD-10-CM | POA: Diagnosis not present

## 2019-03-07 DIAGNOSIS — N739 Female pelvic inflammatory disease, unspecified: Secondary | ICD-10-CM | POA: Diagnosis not present

## 2019-03-07 DIAGNOSIS — N12 Tubulo-interstitial nephritis, not specified as acute or chronic: Secondary | ICD-10-CM

## 2019-03-07 DIAGNOSIS — N10 Acute tubulo-interstitial nephritis: Secondary | ICD-10-CM | POA: Insufficient documentation

## 2019-03-07 DIAGNOSIS — R509 Fever, unspecified: Secondary | ICD-10-CM | POA: Diagnosis not present

## 2019-03-07 DIAGNOSIS — R300 Dysuria: Secondary | ICD-10-CM | POA: Insufficient documentation

## 2019-03-07 DIAGNOSIS — N898 Other specified noninflammatory disorders of vagina: Secondary | ICD-10-CM | POA: Diagnosis not present

## 2019-03-07 DIAGNOSIS — B379 Candidiasis, unspecified: Secondary | ICD-10-CM

## 2019-03-07 DIAGNOSIS — F17210 Nicotine dependence, cigarettes, uncomplicated: Secondary | ICD-10-CM | POA: Insufficient documentation

## 2019-03-07 DIAGNOSIS — R1030 Lower abdominal pain, unspecified: Secondary | ICD-10-CM

## 2019-03-07 DIAGNOSIS — R102 Pelvic and perineal pain: Secondary | ICD-10-CM | POA: Diagnosis not present

## 2019-03-07 LAB — COMPREHENSIVE METABOLIC PANEL
ALT: 14 U/L (ref 0–44)
AST: 16 U/L (ref 15–41)
Albumin: 3.7 g/dL (ref 3.5–5.0)
Alkaline Phosphatase: 63 U/L (ref 38–126)
Anion gap: 6 (ref 5–15)
BUN: 8 mg/dL (ref 6–20)
CO2: 26 mmol/L (ref 22–32)
Calcium: 8.8 mg/dL — ABNORMAL LOW (ref 8.9–10.3)
Chloride: 108 mmol/L (ref 98–111)
Creatinine, Ser: 0.62 mg/dL (ref 0.44–1.00)
GFR calc Af Amer: >60 mL/min (ref >60)
GFR calc non Af Amer: >60 mL/min (ref >60)
Glucose, Bld: 101 mg/dL — ABNORMAL HIGH (ref 70–99)
Potassium: 3.7 mmol/L (ref 3.5–5.1)
Sodium: 140 mmol/L (ref 135–145)
Total Bilirubin: 0.7 mg/dL (ref 0.3–1.2)
Total Protein: 7.4 g/dL (ref 6.5–8.1)

## 2019-03-07 LAB — URINALYSIS, ROUTINE W REFLEX MICROSCOPIC
Bilirubin Urine: NEGATIVE
Glucose, UA: NEGATIVE mg/dL
Hgb urine dipstick: NEGATIVE
Ketones, ur: NEGATIVE mg/dL
Nitrite: POSITIVE — AB
Protein, ur: NEGATIVE mg/dL
Specific Gravity, Urine: 1.011 (ref 1.005–1.030)
pH: 7 (ref 5.0–8.0)

## 2019-03-07 LAB — LIPASE, BLOOD: Lipase: 29 U/L (ref 11–51)

## 2019-03-07 LAB — CBC WITH DIFFERENTIAL/PLATELET
Abs Immature Granulocytes: 0.08 10*3/uL — ABNORMAL HIGH (ref 0.00–0.07)
Basophils Absolute: 0 10*3/uL (ref 0.0–0.1)
Basophils Relative: 0 %
Eosinophils Absolute: 0.1 10*3/uL (ref 0.0–0.5)
Eosinophils Relative: 1 %
HCT: 40.6 % (ref 36.0–46.0)
Hemoglobin: 13.5 g/dL (ref 12.0–15.0)
Immature Granulocytes: 1 %
Lymphocytes Relative: 5 %
Lymphs Abs: 0.7 10*3/uL (ref 0.7–4.0)
MCH: 34 pg (ref 26.0–34.0)
MCHC: 33.3 g/dL (ref 30.0–36.0)
MCV: 102.3 fL — ABNORMAL HIGH (ref 80.0–100.0)
Monocytes Absolute: 0.7 10*3/uL (ref 0.1–1.0)
Monocytes Relative: 6 %
Neutro Abs: 11 10*3/uL — ABNORMAL HIGH (ref 1.7–7.7)
Neutrophils Relative %: 87 %
Platelets: 192 10*3/uL (ref 150–400)
RBC: 3.97 MIL/uL (ref 3.87–5.11)
RDW: 11.3 % — ABNORMAL LOW (ref 11.5–15.5)
WBC: 12.6 10*3/uL — ABNORMAL HIGH (ref 4.0–10.5)
nRBC: 0 % (ref 0.0–0.2)

## 2019-03-07 LAB — PREGNANCY, URINE: Preg Test, Ur: NEGATIVE

## 2019-03-07 LAB — LACTIC ACID, PLASMA: Lactic Acid, Venous: 0.6 mmol/L (ref 0.5–1.9)

## 2019-03-07 LAB — WET PREP, GENITAL
Clue Cells Wet Prep HPF POC: NONE SEEN
Sperm: NONE SEEN
Trich, Wet Prep: NONE SEEN

## 2019-03-07 MED ORDER — FLUCONAZOLE 150 MG PO TABS
150.0000 mg | ORAL_TABLET | Freq: Once | ORAL | Status: AC
  Administered 2019-03-07: 150 mg via ORAL
  Filled 2019-03-07: qty 1

## 2019-03-07 MED ORDER — ONDANSETRON HCL 4 MG/2ML IJ SOLN
4.0000 mg | Freq: Once | INTRAMUSCULAR | Status: AC
  Administered 2019-03-07: 4 mg via INTRAVENOUS
  Filled 2019-03-07: qty 2

## 2019-03-07 MED ORDER — OXYCODONE-ACETAMINOPHEN 5-325 MG PO TABS: 1.0000 | ORAL_TABLET | ORAL | 0 refills | Status: DC | PRN

## 2019-03-07 MED ORDER — MORPHINE SULFATE (PF) 4 MG/ML IV SOLN
4.0000 mg | Freq: Once | INTRAVENOUS | Status: AC
  Administered 2019-03-07: 17:00:00 4 mg via INTRAVENOUS
  Filled 2019-03-07: qty 1

## 2019-03-07 MED ORDER — SODIUM CHLORIDE 0.9 % IV BOLUS
1000.0000 mL | Freq: Once | INTRAVENOUS | Status: AC
  Administered 2019-03-07: 1000 mL via INTRAVENOUS

## 2019-03-07 MED ORDER — LEVOFLOXACIN 750 MG PO TABS: 750.0000 mg | ORAL_TABLET | Freq: Every day | ORAL | 0 refills | Status: AC

## 2019-03-07 MED ORDER — LEVOFLOXACIN 750 MG PO TABS
750.0000 mg | ORAL_TABLET | Freq: Once | ORAL | Status: AC
  Administered 2019-03-07: 750 mg via ORAL
  Filled 2019-03-07: qty 1

## 2019-03-07 MED ORDER — SODIUM CHLORIDE 0.9 % IV SOLN
1.0000 g | Freq: Once | INTRAVENOUS | Status: AC
  Administered 2019-03-07: 1 g via INTRAVENOUS
  Filled 2019-03-07: qty 10

## 2019-03-07 MED ORDER — ONDANSETRON HCL 4 MG PO TABS: 4.0000 mg | ORAL_TABLET | Freq: Three times a day (TID) | ORAL | 0 refills | Status: DC | PRN

## 2019-03-07 MED ORDER — MORPHINE SULFATE (PF) 4 MG/ML IV SOLN
4.0000 mg | Freq: Once | INTRAVENOUS | Status: AC
  Administered 2019-03-07: 4 mg via INTRAVENOUS
  Filled 2019-03-07: qty 1

## 2019-03-07 MED ORDER — DOXYCYCLINE HYCLATE 100 MG PO TABS
100.0000 mg | ORAL_TABLET | Freq: Once | ORAL | Status: AC
  Administered 2019-03-07: 100 mg via ORAL
  Filled 2019-03-07: qty 1

## 2019-03-07 MED ORDER — DOXYCYCLINE HYCLATE 100 MG PO CAPS: 100.0000 mg | ORAL_CAPSULE | Freq: Two times a day (BID) | ORAL | 0 refills | Status: AC

## 2019-03-07 NOTE — ED Provider Notes (Signed)
[facility] DEPT Provider Note   CSN: [id number] Arrival date & time: 03/07/19  1501    History   Chief Complaint Chief Complaint  Patient presents with   Fever   Dysuria    HPI Madine Sarr is a 33 y.o. female.     The history is provided by the patient and medical records.  Abdominal Pain  Pain location:  RLQ, LLQ and suprapubic Pain quality: aching and sharp   Pain severity:  Severe Onset quality:  Gradual Duration:  4 days Timing:  Constant Progression:  Worsening Chronicity:  New Context: not trauma   Relieved by:  Nothing Worsened by:  Palpation and urination Ineffective treatments: antibiotics. Associated symptoms: anorexia, chills, constipation, dysuria, fatigue, fever, nausea and vaginal bleeding   Associated symptoms: no chest pain, no cough, no diarrhea, no shortness of breath, no vaginal discharge and no vomiting   Risk factors: not elderly and not obese     Past Medical History:  Diagnosis Date   Abnormal Pap smear of cervix     Patient Active Problem List   Diagnosis Date Noted   Tobacco abuse 03/24/2017   Tobacco abuse counseling 03/24/2017   Abnormal cervical Papanicolaou smear 03/24/2017   Counseling on health promotion and disease prevention 03/24/2017   Fatigue 03/24/2017    Past Surgical History:  Procedure Laterality Date   LEEP     MOLE REMOVAL       OB History   No obstetric history on file.      Home Medications    Prior to Admission medications   Medication Sig Start Date End Date Taking? Authorizing Provider  JUNEL FE 1/20 1-20 MG-MCG tablet Take 1 tablet by mouth daily. 12/28/15   [provider]    Family History No family history on file.  Social History Social History   Tobacco Use   Smoking status: Current Every Day Smoker    Packs/day: 0.50    Types: Cigarettes   Smokeless tobacco: Never Used  Substance Use Topics   Alcohol use: Yes    Comment: social  use   Drug use: No     Allergies   Seasonal ic [cholestatin]   Review of Systems Review of Systems  Constitutional: Positive for appetite change, chills, fatigue and fever. Negative for diaphoresis.  HENT: Negative for congestion.   Eyes: Negative for visual disturbance.  Respiratory: Negative for cough, chest tightness, shortness of breath, wheezing and stridor.   Cardiovascular: Negative for chest pain, palpitations and leg swelling.  Gastrointestinal: Positive for abdominal pain, anorexia, constipation and nausea. Negative for abdominal distention, blood in stool, diarrhea and vomiting.  Genitourinary: Positive for dysuria, pelvic pain, vaginal bleeding and vaginal pain. Negative for decreased urine volume, frequency, urgency and vaginal discharge.  Musculoskeletal: Negative for back pain, neck pain and neck stiffness.  Skin: Negative for wound.  Neurological: Positive for light-headedness and headaches (mild). Negative for weakness.  Psychiatric/Behavioral: Negative for agitation.  All other systems reviewed and are negative.    Physical Exam Updated Vital Signs BP 120/88 (BP Location: Left Arm)    Pulse (!) 104    Temp 100.2 F (37.9 C) (Oral)    Resp 20    LMP 02/11/2019    SpO2 98%   Physical Exam Vitals signs and nursing note reviewed. Exam conducted with a chaperone present.  Constitutional:      General: She is not in acute distress.    Appearance: She is well-developed. She is not ill-appearing,  toxic-appearing or diaphoretic.  HENT:     Head: Normocephalic and atraumatic.     Nose: No congestion or rhinorrhea.  Eyes:     Conjunctiva/sclera: Conjunctivae normal.     Pupils: Pupils are equal, round, and reactive to light.  Neck:     Musculoskeletal: Neck supple.  Cardiovascular:     Rate and Rhythm: Regular rhythm. Tachycardia present.     Pulses: Normal pulses.     Heart sounds: No murmur.  Pulmonary:     Effort: Pulmonary effort is normal. No respiratory  distress.     Breath sounds: Normal breath sounds. No wheezing, rhonchi or rales.  Chest:     Chest wall: No tenderness.  Abdominal:     General: There is no distension.     Palpations: Abdomen is soft.     Tenderness: There is abdominal tenderness. There is no right CVA tenderness, left CVA tenderness or rebound.  Genitourinary:    Labia:        Right: Tenderness present.        Left: Tenderness present.      Vagina: Vaginal discharge and tenderness present.     Cervix: Cervical motion tenderness and discharge present.     Uterus: Tender.      Adnexa:        Right: Tenderness present.        Left: Tenderness present.      Comments: Patient had diffuse cervical motion tenderness and bilateral adnexal tenderness.  Patient had extensive discharge on pelvic exam.  No bleeding seen. Musculoskeletal:        General: Tenderness present.  Skin:    General: Skin is warm and dry.     Capillary Refill: Capillary refill takes less than 2 seconds.     Findings: No erythema or rash.  Neurological:     General: No focal deficit present.     Mental Status: She is alert.  Psychiatric:        Mood and Affect: Mood normal.      ED Treatments / Results  Labs (all labs ordered are listed, but only abnormal results are displayed) Labs Reviewed  WET PREP, GENITAL - Abnormal; Notable for the following components:      Result Value   Yeast Wet Prep HPF POC PRESENT (*)    WBC, Wet Prep HPF POC RARE (*)    All other components within normal limits  URINALYSIS, ROUTINE W REFLEX MICROSCOPIC - Abnormal; Notable for the following components:   Color, Urine AMBER (*)    Nitrite POSITIVE (*)    Leukocytes,Ua SMALL (*)    Bacteria, UA MANY (*)    Non Squamous Epithelial 0-5 (*)    All other components within normal limits  CBC WITH DIFFERENTIAL/PLATELET - Abnormal; Notable for the following components:   WBC 12.6 (*)    MCV 102.3 (*)    RDW 11.3 (*)    Neutro Abs 11.0 (*)    Abs Immature  Granulocytes 0.08 (*)    All other components within normal limits  COMPREHENSIVE METABOLIC PANEL - Abnormal; Notable for the following components:   Glucose, Bld 101 (*)    Calcium 8.8 (*)    All other components within normal limits  URINE CULTURE  PREGNANCY, URINE  LIPASE, BLOOD  LACTIC ACID, PLASMA  GC/CHLAMYDIA PROBE AMP ([place]) NOT AT [facility]  WET PREP  (BD AFFIRM) ([place])    EKG None  Radiology US Pelvis Complete  Result Date: 03/07/2019 CLINICAL  DATA:  Initial evaluation for acute pelvic pain, pressure. EXAM: TRANSABDOMINAL AND TRANSVAGINAL ULTRASOUND OF PELVIS DOPPLER ULTRASOUND OF OVARIES TECHNIQUE: Both transabdominal and transvaginal ultrasound examinations of the pelvis were performed. Transabdominal technique was performed for global imaging of the pelvis including uterus, ovaries, adnexal regions, and pelvic cul-de-sac. It was necessary to proceed with endovaginal exam following the transabdominal exam to visualize the uterus, endometrium, and ovaries. Color and duplex Doppler ultrasound was utilized to evaluate blood flow to the ovaries. COMPARISON:  None. FINDINGS: Uterus Measurements: 6.2 x 4.2 x 4.5 cm = volume: 61.3 mL. Uterus is retroverted. 2.0 x 1.8 x 1.7 cm intramural fibroid present at the anterior uterus. There is question of a discrete mass at the level of the cervix measuring approximately 2.7 x 1.5 x 2.1 cm (image 33). Endometrium Thickness: 5 mm.  No focal abnormality visualized. Right ovary Measurements: 2.4 x 1.7 x 1.9 cm = volume: 4.2 mL. Normal appearance/no adnexal mass. Left ovary Measurements: 3.0 x 1.6 x 1.7 cm = volume: 4.0 mL. Normal appearance/no adnexal mass. Pulsed Doppler evaluation of both ovaries demonstrates normal low-resistance arterial and venous waveforms. Other findings Trace free fluid within the pelvis, most likely physiologic. IMPRESSION: 1. 2.7 x 1.5 x 2.1 cm complex lesion at the level of the cervix, indeterminate. While this  finding could reflect a complex nabothian cyst and/or cysts, a focal cervical mass could also be considered. Gynecologic referral for further evaluation and direct visualization recommended. 2. 2 cm intramural fibroid. 3. Otherwise unremarkable and normal pelvic ultrasound. No evidence for torsion or other acute finding. Electronically Signed   By: Jeannine Boga M.D.   On: 03/07/2019 20:19   [place] Art/ven Flow Abd Pelv Doppler  Result Date: 03/07/2019 CLINICAL DATA:  Initial evaluation for acute pelvic pain, pressure. EXAM: TRANSABDOMINAL AND TRANSVAGINAL ULTRASOUND OF PELVIS DOPPLER ULTRASOUND OF OVARIES TECHNIQUE: Both transabdominal and transvaginal ultrasound examinations of the pelvis were performed. Transabdominal technique was performed for global imaging of the pelvis including uterus, ovaries, adnexal regions, and pelvic cul-de-sac. It was necessary to proceed with endovaginal exam following the transabdominal exam to visualize the uterus, endometrium, and ovaries. Color and duplex Doppler ultrasound was utilized to evaluate blood flow to the ovaries. COMPARISON:  None. FINDINGS: Uterus Measurements: 6.2 x 4.2 x 4.5 cm = volume: 61.3 mL. Uterus is retroverted. 2.0 x 1.8 x 1.7 cm intramural fibroid present at the anterior uterus. There is question of a discrete mass at the level of the cervix measuring approximately 2.7 x 1.5 x 2.1 cm (image 33). Endometrium Thickness: 5 mm.  No focal abnormality visualized. Right ovary Measurements: 2.4 x 1.7 x 1.9 cm = volume: 4.2 mL. Normal appearance/no adnexal mass. Left ovary Measurements: 3.0 x 1.6 x 1.7 cm = volume: 4.0 mL. Normal appearance/no adnexal mass. Pulsed Doppler evaluation of both ovaries demonstrates normal low-resistance arterial and venous waveforms. Other findings Trace free fluid within the pelvis, most likely physiologic. IMPRESSION: 1. 2.7 x 1.5 x 2.1 cm complex lesion at the level of the cervix, indeterminate. While this finding could  reflect a complex nabothian cyst and/or cysts, a focal cervical mass could also be considered. Gynecologic referral for further evaluation and direct visualization recommended. 2. 2 cm intramural fibroid. 3. Otherwise unremarkable and normal pelvic ultrasound. No evidence for torsion or other acute finding. Electronically Signed   By: Jeannine Boga M.D.   On: 03/07/2019 20:19   US Pelvic Complete W Transvaginal And Torsion R/o  Result Date: 03/07/2019 CLINICAL DATA:  Initial evaluation for acute pelvic pain, pressure. EXAM: TRANSABDOMINAL AND TRANSVAGINAL ULTRASOUND OF PELVIS DOPPLER ULTRASOUND OF OVARIES TECHNIQUE: Both transabdominal and transvaginal ultrasound examinations of the pelvis were performed. Transabdominal technique was performed for global imaging of the pelvis including uterus, ovaries, adnexal regions, and pelvic cul-de-sac. It was necessary to proceed with endovaginal exam following the transabdominal exam to visualize the uterus, endometrium, and ovaries. Color and duplex Doppler ultrasound was utilized to evaluate blood flow to the ovaries. COMPARISON:  None. FINDINGS: Uterus Measurements: 6.2 x 4.2 x 4.5 cm = volume: 61.3 mL. Uterus is retroverted. 2.0 x 1.8 x 1.7 cm intramural fibroid present at the anterior uterus. There is question of a discrete mass at the level of the cervix measuring approximately 2.7 x 1.5 x 2.1 cm (image 33). Endometrium Thickness: 5 mm.  No focal abnormality visualized. Right ovary Measurements: 2.4 x 1.7 x 1.9 cm = volume: 4.2 mL. Normal appearance/no adnexal mass. Left ovary Measurements: 3.0 x 1.6 x 1.7 cm = volume: 4.0 mL. Normal appearance/no adnexal mass. Pulsed Doppler evaluation of both ovaries demonstrates normal low-resistance arterial and venous waveforms. Other findings Trace free fluid within the pelvis, most likely physiologic. IMPRESSION: 1. 2.7 x 1.5 x 2.1 cm complex lesion at the level of the cervix, indeterminate. While this finding could  reflect a complex nabothian cyst and/or cysts, a focal cervical mass could also be considered. Gynecologic referral for further evaluation and direct visualization recommended. 2. 2 cm intramural fibroid. 3. Otherwise unremarkable and normal pelvic ultrasound. No evidence for torsion or other acute finding. Electronically Signed   By: Jeannine Boga M.D.   On: 03/07/2019 20:19    Procedures Procedures (including critical care time)  Medications Ordered in ED Medications  morphine 4 MG/ML injection 4 mg (4 mg Intravenous Given 03/07/19 1728)  ondansetron (ZOFRAN) injection 4 mg (4 mg Intravenous Given 03/07/19 1728)  sodium chloride 0.9 % bolus 1,000 mL (0 mLs Intravenous Stopped 03/07/19 1834)  morphine 4 MG/ML injection 4 mg (4 mg Intravenous Given 03/07/19 1957)  cefTRIAXone (ROCEPHIN) 1 g in sodium chloride 0.9 % 100 mL IVPB (0 g Intravenous Stopped 03/07/19 2148)  doxycycline (VIBRA-TABS) tablet 100 mg (100 mg Oral Given 03/07/19 2109)  levofloxacin (LEVAQUIN) tablet 750 mg (750 mg Oral Given 03/07/19 2109)  fluconazole (DIFLUCAN) tablet 150 mg (150 mg Oral Given 03/07/19 2109)     Initial Impression / Assessment and Plan / ED Course  I have reviewed the triage vital signs and the nursing notes.  Pertinent labs & imaging results that were available during my care of the patient were reviewed by me and considered in my medical decision making (see chart for details).        Aurie Harroun is a 33 y.o. female with past medical history significant for prior UTI and bacterial vaginosis who presents with 4 days of worsening lower abdominal pain, nausea, fevers, chills, dysuria, pelvic pain, vaginal irritation, decreased appetite.  Patient reports that she was started on antibiotics several days ago for likely urinary tract infection.  She reports she received a antibiotic shot at urgent care and then was started on ciprofloxacin.  She reports that her symptoms have continued and worsened with  lower abdominal pain including the right lower quadrant.  She reports that she has not had vaginal discharge but is had some vaginal irritation and has significant dysuria.  She reports that her lower abdominal pain has worsened and is now between a 9 out of 10 and a  4 out of 10.  She reports it comes and goes.  She has not had any appetite has had nausea but no vomiting.  She reports she developed fevers and she is also developed radiation of the lower abdominal pain to her bilateral flanks.  No rashes.  No recent injuries.  She reports this feels worse than prior UTIs.  She denies history of kidney stones.  She reports no history of appendicitis or other abdominal surgeries.  She denies any other complaints including no cough, congestion, shortness of breath.  She reports her last menstrual cycle started April 25.  She reports that she started back on her birth control medication has had some intermittent spotting.  On exam, patient does have tenderness across her lower abdomen as well as worsened in the right lower quadrant.  Patient had normal bowel sounds.  Lungs clear and chest nontender.  Back nontender.  Patient has some tenderness in her flanks.  No CVA tenderness.  Legs nontender.  Chaperone will be utilized and a pelvic exam be performed.  Clinically I am concerned about urinary tract infection, pyelonephritis with her nausea, fever, and developing flank pain, however with the abdominal pain also concern for possible but ascites.  We will get screening laboratory testing, urinalysis, and based on pelvic exam, we will start with a CT abdomen pelvis or start with a transvaginal ultrasound to look for abnormality before likely continuing to CT scan.  Patient will be given pain medicine, nausea resin, and fluids during her work-up.  Anticipate reassessment after work-up.  Patient's pelvic exam revealed exquisite cervical motion tenderness and bilateral adnexal tenderness.  Patient had a  significant amount of discharge and purulence on exam.  No bleeding seen.  No masslike abnormality seen on the cervix.  With this abnormality exam, patient will have pelvic ultrasound.  Pelvic ultrasound reveals an abnormality in the cervix likely a cyst versus mass.  I spoke with OB/GYN to rule out TOA and they did not feel she has a TOA but rather think she has a cyst.  They recommend seeing her in clinic.  They do recommend treating her as if she has PID given the amount of tenderness.  They recommended a dose of Rocephin here and then doxycycline for 14 days.  This will be ordered.  Patient does have yeast on wet prep, will give fluconazole.  Due to the patient's urinalysis returning showing obvious urinary tract infection with nitrites, leukocytes and bacteria, with her tenderness going to her flanks, fever, tachycardia, and nausea, she will be treated for pyelonephritis.  She will be given prescription for Levaquin for this.  Patient given prescription for pain medicine nausea medicine.  Patient was offered further work-up including CT scan or even admission however patient is feeling much better and was able to eat and drink.  Patient would rather go home, follow-up with OB/GYN, and follow strict return precautions.  Patient was feeling better after medications.  Her heart rate improved after fluids, suspect mild dehydration.  Patient had no other questions or concerns and was discharged in good condition with understanding of plan of care.     Final Clinical Impressions(s) / ED Diagnoses   Final diagnoses:  Pyelonephritis  PID (acute pelvic inflammatory disease)  Yeast infection  Lower abdominal pain  Pelvic pain  Vaginal discharge  Dysuria  Fever, unspecified fever cause    ED Discharge Orders         Ordered    doxycycline (VIBRAMYCIN) 100 MG capsule  2 times daily     03/07/19 2120    levofloxacin (LEVAQUIN) 750 MG tablet  Daily     03/07/19 2120    oxyCODONE-acetaminophen  (PERCOCET/ROXICET) 5-325 MG tablet  Every 4 hours PRN     03/07/19 2120    ondansetron (ZOFRAN) 4 MG tablet  Every 8 hours PRN     03/07/19 2120          Clinical Impression: 1. Pyelonephritis   2. Pain   3. PID (acute pelvic inflammatory disease)   4. Yeast infection   5. Lower abdominal pain   6. Pelvic pain   7. Vaginal discharge   8. Dysuria   9. Fever, unspecified fever cause     Disposition: Discharge  Condition: Good  I have discussed the results, Dx and Tx plan with the pt(& family if present). He/she/they expressed understanding and agree(s) with the plan. Discharge instructions discussed at great length. Strict return precautions discussed and pt &/or family have verbalized understanding of the instructions. No further questions at time of discharge.    New Prescriptions   DOXYCYCLINE (VIBRAMYCIN) 100 MG CAPSULE    Take 1 capsule (100 mg total) by mouth 2 (two) times daily for 14 days.   LEVOFLOXACIN (LEVAQUIN) 750 MG TABLET    Take 1 tablet (750 mg total) by mouth daily for 5 days.   ONDANSETRON (ZOFRAN) 4 MG TABLET    Take 1 tablet (4 mg total) by mouth every 8 (eight) hours as needed for nausea or vomiting.   OXYCODONE-ACETAMINOPHEN (PERCOCET/ROXICET) 5-325 MG TABLET    Take 1 tablet by mouth every 4 (four) hours as needed for severe pain.    Follow Up: [facility] [address], [address] [id number] mc [place] [address] [phone]  Please follow-up with Dr. Sheran Lawless with the OB/GYN team.  [facility] [address] [id number] [place] [address], [place], [place] [address] [phone]        Dorthie Santini, Gwenyth Allegra, MD 03/07/19 [phone]

## 2019-03-07 NOTE — Discharge Instructions (Addendum)
Your work-up today showed evidence of pyelonephritis (urinary tract infection that spread to the kidneys) as infection of your cervix (PID).  Please take the antibiotics as we discussed and use the pain and nausea medicine to help with your symptoms.  Please follow-up with the OB/GYN team for evaluation of the cervical abnormality.  Please stay hydrated and rest.  If any symptoms change or worsen, please return to the nearest emergency department.

## 2019-03-07 NOTE — ED Triage Notes (Signed)
Pt reports having continued pains/pressure in vagina and rectum after taking medications. Pt reports that she talked to her PCP who advised to come to ED.

## 2019-03-07 NOTE — ED Notes (Signed)
US at bedside

## 2019-03-08 LAB — GC/CHLAMYDIA PROBE AMP (~~LOC~~) NOT AT ARMC
Chlamydia: NEGATIVE
Neisseria Gonorrhea: NEGATIVE

## 2019-03-08 LAB — URINE CULTURE: Culture: NO GROWTH

## 2019-11-14 DIAGNOSIS — Z20822 Contact with and (suspected) exposure to covid-19: Secondary | ICD-10-CM | POA: Diagnosis not present

## 2019-11-14 DIAGNOSIS — J069 Acute upper respiratory infection, unspecified: Secondary | ICD-10-CM | POA: Diagnosis not present

## 2019-12-12 DIAGNOSIS — R1033 Periumbilical pain: Secondary | ICD-10-CM | POA: Diagnosis not present

## 2019-12-12 DIAGNOSIS — N898 Other specified noninflammatory disorders of vagina: Secondary | ICD-10-CM | POA: Diagnosis not present

## 2020-01-17 DIAGNOSIS — Z1152 Encounter for screening for COVID-19: Secondary | ICD-10-CM | POA: Diagnosis not present

## 2020-01-17 DIAGNOSIS — U071 COVID-19: Secondary | ICD-10-CM | POA: Diagnosis not present

## 2020-01-17 DIAGNOSIS — R05 Cough: Secondary | ICD-10-CM | POA: Diagnosis not present

## 2020-01-17 DIAGNOSIS — J3489 Other specified disorders of nose and nasal sinuses: Secondary | ICD-10-CM | POA: Diagnosis not present

## 2020-03-14 DIAGNOSIS — N912 Amenorrhea, unspecified: Secondary | ICD-10-CM | POA: Diagnosis not present

## 2020-03-14 DIAGNOSIS — N898 Other specified noninflammatory disorders of vagina: Secondary | ICD-10-CM | POA: Diagnosis not present

## 2020-03-14 DIAGNOSIS — N760 Acute vaginitis: Secondary | ICD-10-CM | POA: Diagnosis not present

## 2020-04-13 ENCOUNTER — Other Ambulatory Visit: Payer: Self-pay

## 2020-04-13 ENCOUNTER — Emergency Department (HOSPITAL_COMMUNITY)
Admission: EM | Admit: 2020-04-13 | Discharge: 2020-04-13 | Disposition: A | Discharge status: Home / Self Care | Payer: BC Managed Care – PPO | Attending: Emergency Medicine | Admitting: Emergency Medicine

## 2020-04-13 DIAGNOSIS — N3000 Acute cystitis without hematuria: Secondary | ICD-10-CM | POA: Diagnosis not present

## 2020-04-13 DIAGNOSIS — F17210 Nicotine dependence, cigarettes, uncomplicated: Secondary | ICD-10-CM | POA: Insufficient documentation

## 2020-04-13 DIAGNOSIS — R300 Dysuria: Secondary | ICD-10-CM | POA: Diagnosis not present

## 2020-04-13 LAB — URINALYSIS, ROUTINE W REFLEX MICROSCOPIC
Bilirubin Urine: NEGATIVE
Glucose, UA: NEGATIVE mg/dL
Ketones, ur: NEGATIVE mg/dL
Nitrite: NEGATIVE
Protein, ur: NEGATIVE mg/dL
Specific Gravity, Urine: 1.005 (ref 1.005–1.030)
pH: 8 (ref 5.0–8.0)

## 2020-04-13 LAB — I-STAT BETA HCG BLOOD, ED (MC, WL, AP ONLY): I-stat hCG, quantitative: 10.3 m[IU]/mL — ABNORMAL HIGH (ref <5)

## 2020-04-13 MED ORDER — CEPHALEXIN 250 MG PO CAPS
250.0000 mg | ORAL_CAPSULE | Freq: Four times a day (QID) | ORAL | 0 refills | Status: DC
Start: 2020-04-13 — End: 2022-04-13

## 2020-04-13 MED ORDER — DICYCLOMINE HCL 20 MG PO TABS
20.0000 mg | ORAL_TABLET | Freq: Three times a day (TID) | ORAL | 0 refills | Status: AC | PRN
Start: 2020-04-13 — End: ?

## 2020-04-13 NOTE — Discharge Instructions (Addendum)
Call your gynecologist for follow-up on about the urine infection next week.  Also asked him to recheck your pregnancy test.  The point-of-care pregnancy test today was slightly elevated.  This may be a false elevation.  Also ask your gynecologist to recheck you for the ongoing difficulty with pelvic pain and bleeding.

## 2020-04-13 NOTE — ED Provider Notes (Signed)
[facility] DEPT Provider Note   CSN: [id number] Arrival date & time: 04/13/20  1219     History Chief Complaint  Patient presents with   Dysuria    Alicia Lucas is a 34 y.o. female.  HPI She is here for evaluation of general malaise, weakness, achiness, dysuria, urinary frequency and ongoing dyspareunia.  She recently saw her gynecologist, had a pelvic exam, and was treated for bacterial vaginosis.  She completed that treatment 2 days ago.  She does not currently have a vaginal discharge but has some irritation, of the external vagina which she states feels like a yeast infection.  Because of that she is using a antifungal medication, topically.  She denies fever, chills, vomiting, dizziness or weakness.  She states that she frequently has BV and UTIs.  She was in the ED about a year ago and was treated for pyelonephritis.  She states her symptoms and were much worse they are now.  She reports having sex because it is painful and tends to cause vaginal bleeding.  She says she has not had a bleeding since she had a Covid infection, about 6 months ago.  She does not think that she is pregnant.  There are no other known modifying factors.    Past Medical History:  Diagnosis Date   Abnormal Pap smear of cervix     Patient Active Problem List   Diagnosis Date Noted   Tobacco abuse 03/24/2017   Tobacco abuse counseling 03/24/2017   Abnormal cervical Papanicolaou smear 03/24/2017   Counseling on health promotion and disease prevention 03/24/2017   Fatigue 03/24/2017    Past Surgical History:  Procedure Laterality Date   LEEP     MOLE REMOVAL       OB History   No obstetric history on file.     No family history on file.  Social History   Tobacco Use   Smoking status: Current Every Day Smoker    Packs/day: 0.50    Types: Cigarettes   Smokeless tobacco: Never Used  Vaping Use   Vaping Use: Never used  Substance Use Topics    Alcohol use: Yes    Comment: social use   Drug use: No    Home Medications Prior to Admission medications   Medication Sig Start Date End Date Taking? Authorizing Provider  AZO-CRANBERRY PO Take 1 tablet by mouth daily as needed (painful urination).   Yes [provider]  ibuprofen (ADVIL) 200 MG tablet Take 200 mg by mouth every 6 (six) hours as needed for mild pain.   Yes [provider]  metroNIDAZOLE (METROGEL) 0.75 % vaginal gel Place vaginally at bedtime. 03/14/20  Yes [provider]  Multiple Vitamin (MULTIVITAMIN ADULT) TABS Take 1 tablet by mouth daily.   Yes [provider]  nystatin cream (MYCOSTATIN) Apply 1 application topically daily as needed for dry skin.  04/09/20  Yes [provider]  oxyCODONE-acetaminophen (PERCOCET/ROXICET) 5-325 MG tablet Take 1 tablet by mouth every 4 (four) hours as needed for severe pain. 03/07/19  Yes Tegeler, Gwenyth Allegra, MD  cephALEXin (KEFLEX) 250 MG capsule Take 1 capsule (250 mg total) by mouth 4 (four) times daily. 04/13/20   Daleen Bo, MD  dicyclomine (BENTYL) 20 MG tablet Take 1 tablet (20 mg total) by mouth every 8 (eight) hours as needed (cramping). 04/13/20   Daleen Bo, MD  ondansetron (ZOFRAN) 4 MG tablet Take 1 tablet (4 mg total) by mouth every 8 (eight) hours as  needed for nausea or vomiting. Patient not taking: Reported on 04/13/2020 03/07/19   Tegeler, Gwenyth Allegra, MD  phenazopyridine (PYRIDIUM) 100 MG tablet Take 100 mg by mouth 3 (three) times daily as needed for pain. Patient not taking: Reported on 04/13/2020    [provider]    Allergies    Patient has no known allergies.  Review of Systems   Review of Systems  All other systems reviewed and are negative.   Physical Exam Updated Vital Signs BP 119/81 (BP Location: Right Arm)    Pulse 80    Temp 98.8 F (37.1 C) (Oral)    Resp 16    Ht 5\' 7"  (1.702 m)    Wt 58.1 kg    LMP 12/15/2019    SpO2 98%    BMI 20.05  kg/m   Physical Exam Vitals and nursing note reviewed.  Constitutional:      General: She is not in acute distress.    Appearance: Normal appearance. She is well-developed and normal weight. She is not ill-appearing or diaphoretic.  HENT:     Head: Normocephalic and atraumatic.     Right Ear: External ear normal.     Left Ear: External ear normal.  Eyes:     Conjunctiva/sclera: Conjunctivae normal.     Pupils: Pupils are equal, round, and reactive to light.  Neck:     Trachea: Phonation normal.  Cardiovascular:     Rate and Rhythm: Normal rate and regular rhythm.     Heart sounds: Normal heart sounds.  Pulmonary:     Effort: Pulmonary effort is normal. No respiratory distress.     Breath sounds: Normal breath sounds. No stridor.  Abdominal:     General: There is no distension.     Palpations: Abdomen is soft.     Tenderness: There is no abdominal tenderness.  Genitourinary:    Comments: No costovertebral angle tenderness. Musculoskeletal:        General: Normal range of motion.     Cervical back: Normal range of motion and neck supple.  Skin:    General: Skin is warm and dry.     Coloration: Skin is not jaundiced or pale.  Neurological:     Mental Status: She is alert and oriented to person, place, and time.     Cranial Nerves: No cranial nerve deficit.     Sensory: No sensory deficit.     Motor: No abnormal muscle tone.     Coordination: Coordination normal.  Psychiatric:        Mood and Affect: Mood normal.        Behavior: Behavior normal.        Thought Content: Thought content normal.        Judgment: Judgment normal.     ED Results / Procedures / Treatments   Labs (all labs ordered are listed, but only abnormal results are displayed) Labs Reviewed  URINALYSIS, ROUTINE W REFLEX MICROSCOPIC - Abnormal; Notable for the following components:      Result Value   Hgb urine dipstick SMALL (*)    Leukocytes,Ua LARGE (*)    Bacteria, UA RARE (*)    All other  components within normal limits  I-STAT BETA HCG BLOOD, ED ([facility], [facility], [facility] ONLY) - Abnormal; Notable for the following components:   I-stat hCG, quantitative 10.3 (*)    All other components within normal limits  URINE CULTURE    EKG None  Radiology No results found.  Procedures Procedures (including  critical care time)  Medications Ordered in ED Medications - No data to display  ED Course  I have reviewed the triage vital signs and the nursing notes.  Pertinent labs & imaging results that were available during my care of the patient were reviewed by me and considered in my medical decision making (see chart for details).  Clinical Course as of Apr 13 1614  Sat Apr 13, 2020  1603 Minimal elevation  I-Stat beta hCG blood, ED(!) [EW]  1603 Normal except small amount of hemoglobin, large leukocyte esterase, 21-50 RBCs, rare bacteria.  Urinalysis, Routine w reflex microscopic- may I&O cath if menses(!) [EW]    Clinical Course User Index [EW] Daleen Bo, MD   MDM Rules/Calculators/A&P                           Patient Vitals for the past 24 hrs:  BP Temp Temp src Pulse Resp SpO2 Height Weight  04/13/20 1456 119/81 -- -- 80 16 98 % -- --  04/13/20 1229 -- -- -- -- -- -- 5\' 7"  (1.702 m) 58.1 kg  04/13/20 1227 (!) 124/98 98.8 F (37.1 C) Oral 90 17 95 % -- --    4:15 PM Reevaluation with update and discussion. After initial assessment and treatment, an updated evaluation reveals she is comfortable, and has no further complaints.  I discussed with her.  We discussed the minimal elevation of the i-STAT hCG.  This is likely a false elevation.  Patient is comfortable rechecking this next week when she sees her gynecologist.  All questions answered. Daleen Bo   Medical Decision Making:  This patient is presenting for evaluation of symptoms for urinary tract infection and ongoing pelvic pain, which does require a range of treatment options, and is a complaint that involves a  moderate risk of morbidity and mortality. The differential diagnoses include UTI, pregnancy, chronic pelvic pain. I decided to review old records, and in summary healthy young female, with ongoing dyspareunia, occasional bleeding with sexual intercourse, and current urinary tract infections..  I did not require additional historical information from anyone. Thank you Clinical Laboratory Tests Ordered, included i-STAT pregnancy test, Urinalysis. Review indicates findings consistent with UTI, culture ordered.  Trace elevation of i-STAT hCG..     Critical Interventions-clinical evaluation, laboratory testing, observation reassessment  After These Interventions, the Patient was reevaluated and was found stable for discharge.  Suspect UTI, with symptoms and abnormal urinalysis.  Doubt pregnancy.  CRITICAL CARE-no Performed by: Daleen Bo  Nursing Notes Reviewed/ Care Coordinated Applicable Imaging Reviewed Interpretation of Laboratory Data incorporated into ED treatment  The patient appears reasonably screened and/or stabilized for discharge and I doubt any other medical condition or other [facility] requiring further screening, evaluation, or treatment in the ED at this time prior to discharge.  Plan: Home Medications-OTC as needed; Home Treatments-fluids, rest; return here if the recommended treatment, does not improve the symptoms; Recommended follow up-gynecology checkup next week and as needed.  Follow-up on culture results and repeat pregnancy testing.     Final Clinical Impression(s) / ED Diagnoses Final diagnoses:  Acute cystitis without hematuria    Rx / DC Orders ED Discharge Orders         Ordered    cephALEXin (KEFLEX) 250 MG capsule  4 times daily     Discontinue  Reprint     04/13/20 1611    dicyclomine (BENTYL) 20 MG tablet  Every 8 hours PRN  Discontinue  Reprint     04/13/20 1611           Daleen Bo, MD 04/13/20 1615

## 2020-04-13 NOTE — ED Triage Notes (Signed)
Patient reports painful urination and suspects UTI/infection. Patient says this happens every time she has sex. Patient reports taking ibuprofen and oxycodone for pain. Patient rates pain 8/10

## 2020-04-16 LAB — URINE CULTURE: Culture: >=100000 — AB

## 2020-04-17 ENCOUNTER — Telehealth: Payer: Self-pay | Admitting: Emergency Medicine

## 2020-04-17 NOTE — Telephone Encounter (Signed)
Post ED Visit - Positive Culture Follow-up  Culture report reviewed by antimicrobial stewardship pharmacist: [place] Team []  Elenor Quinones, Pharm.D. []  Heide Guile, Pharm.D., BCPS AQ-ID []  Parks Neptune, Pharm.D., BCPS []  Alycia Rossetti, Pharm.D., BCPS []  [place], [place].D., BCPS, AAHIVP []  Legrand Como, Pharm.D., BCPS, AAHIVP []  Salome Arnt, PharmD, BCPS []  Johnnette Gourd, PharmD, BCPS []  Hughes Better, PharmD, BCPS []  Leeroy Cha, PharmD []  Laqueta Linden, PharmD, BCPS []  Albertina Parr, PharmD  [place] Team []  Leodis Sias, PharmD []  Lindell Spar, PharmD []  Royetta Asal, PharmD []  Graylin Shiver, Rph []  Rema Fendt) Glennon Mac, PharmD []  Arlyn Dunning, PharmD []  Netta Cedars, PharmD []  Dia Sitter, PharmD []  Leone Haven, PharmD []  Gretta Arab, PharmD []  Theodis Shove, PharmD []  Peggyann Juba, PharmD []  Reuel Boom, PharmD   Positive urine culture Treated with cephalexin, organism sensitive to the same and no further patient follow-up is required at this time.  Hazle Nordmann 04/17/2020, 12:34 PM

## 2021-02-11 IMAGING — US US PELVIS COMPLETE TRANSABD/TRANSVAG W DUPLEX
1 series · 13 of 25 positions shown · non-contrast
Comparison: None.

CLINICAL DATA: Initial evaluation for acute pelvic pain, pressure.

EXAM:
TRANSABDOMINAL AND TRANSVAGINAL ULTRASOUND OF PELVIS
DOPPLER ULTRASOUND OF OVARIES
TECHNIQUE: Both transabdominal and transvaginal ultrasound examinations of the
pelvis were performed. Transabdominal technique was performed for
global imaging of the pelvis including uterus, ovaries, adnexal
regions, and pelvic cul-de-sac.
It was necessary to proceed with endovaginal exam following the
transabdominal exam to visualize the uterus, endometrium, and
ovaries. Color and duplex Doppler ultrasound was utilized to
evaluate blood flow to the ovaries.

[Series 1: us pelvis complete transabd/transvag w duplex · 13 of 34 slices shown]
[im 1/34]
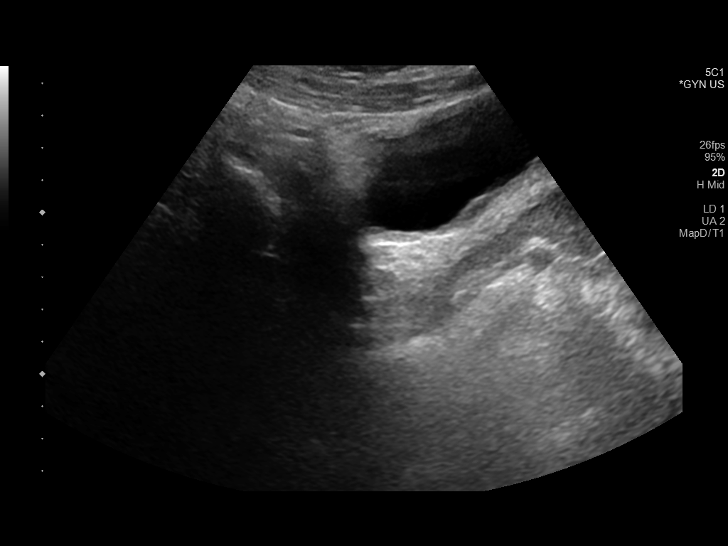
[im 3/34]
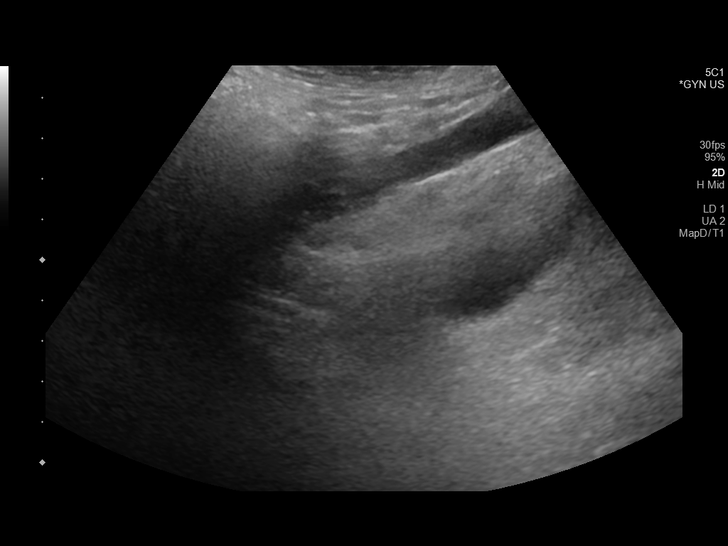
[im 6/34]
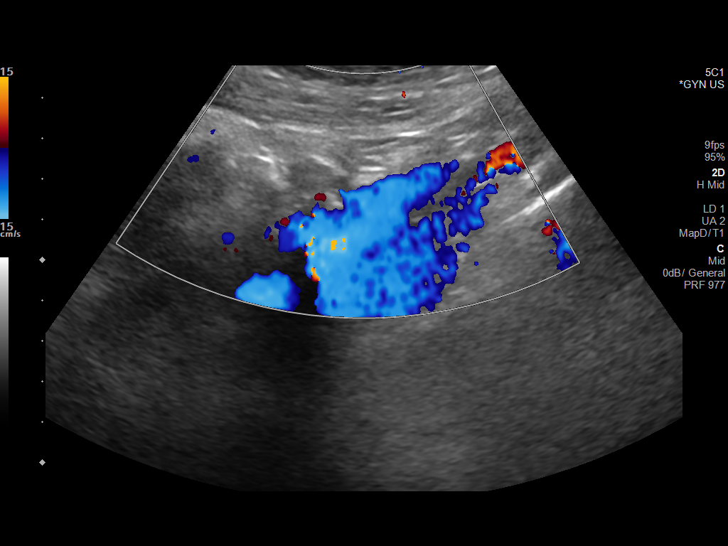
[im 9/34]
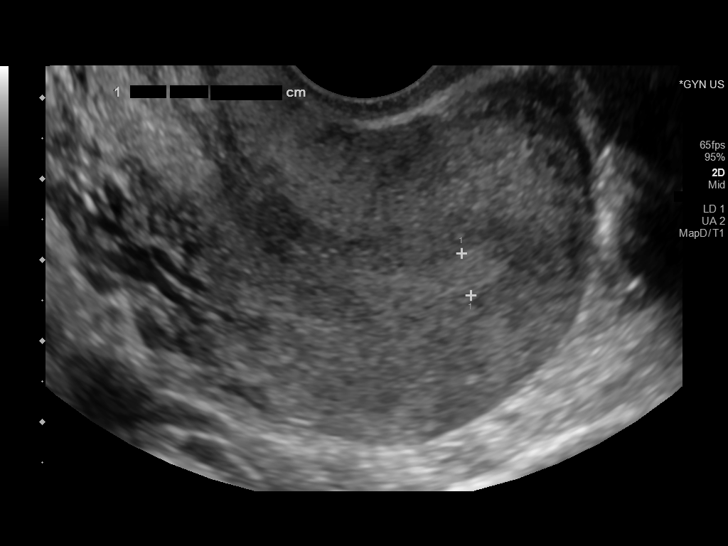
[im 12/34]
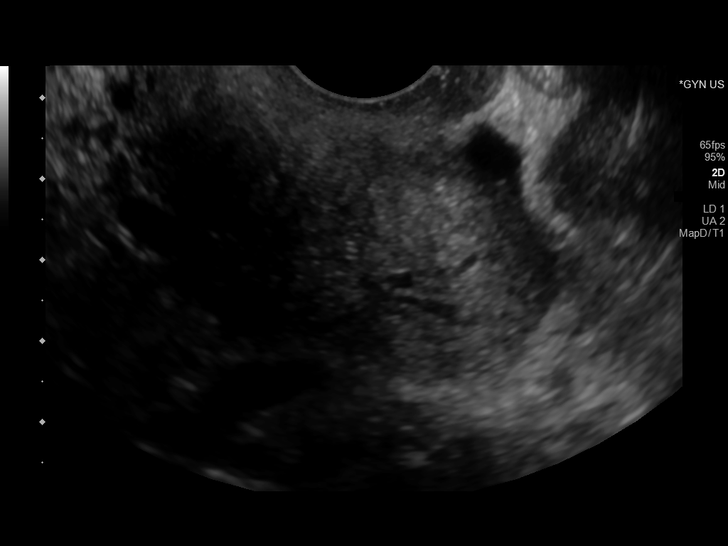
[im 14/34]
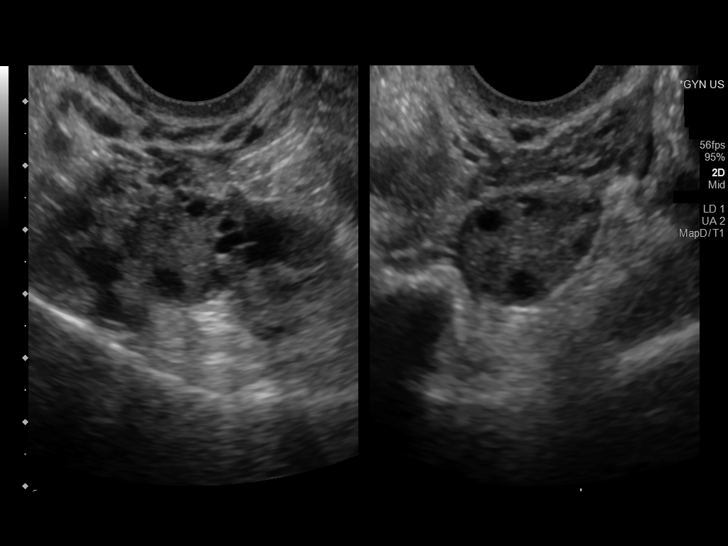
[im 17/34]
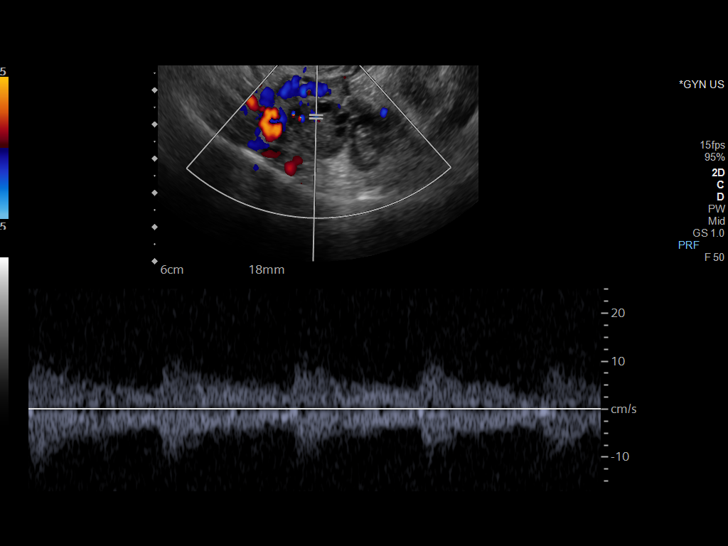
[im 20/34]
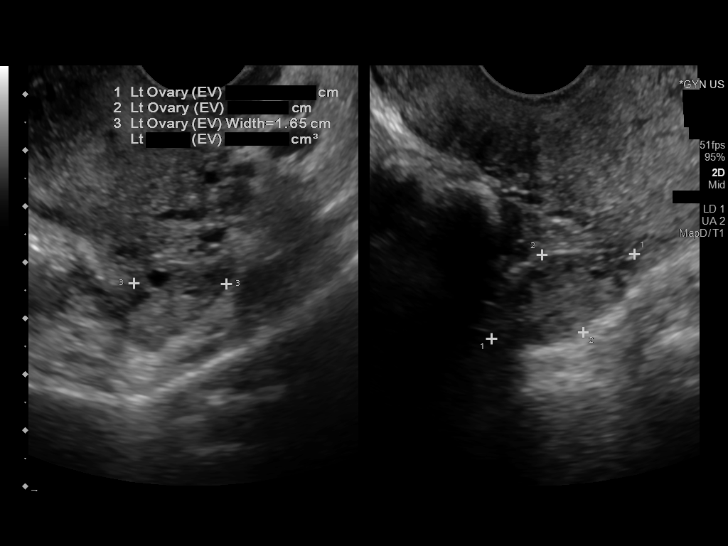
[im 23/34]
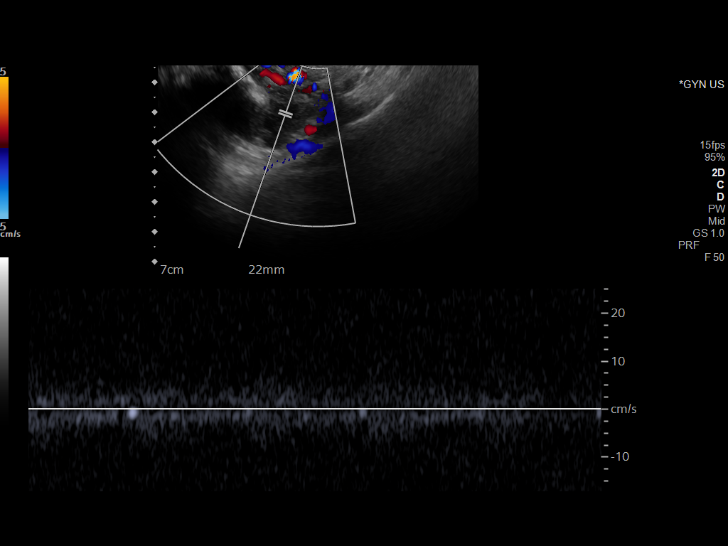
[im 25/34]
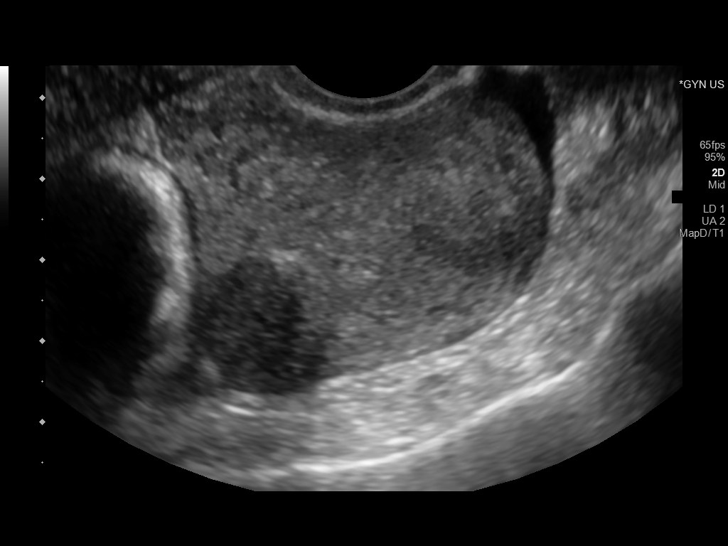
[im 28/34]
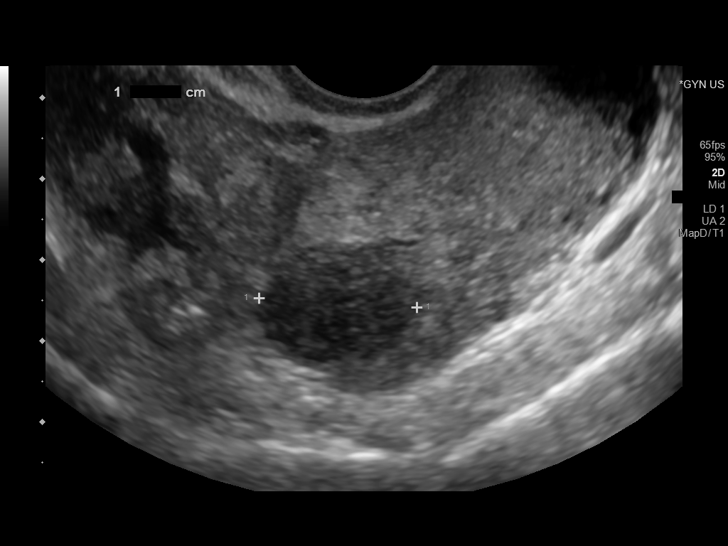
[im 31/34]
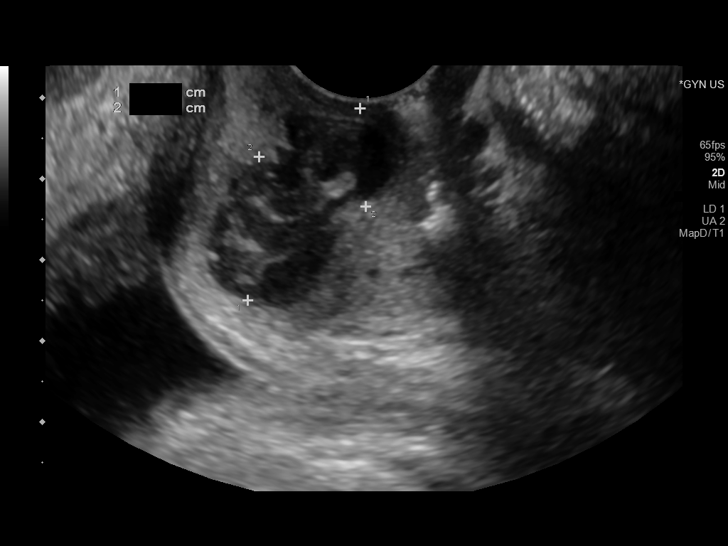
[im 34/34]
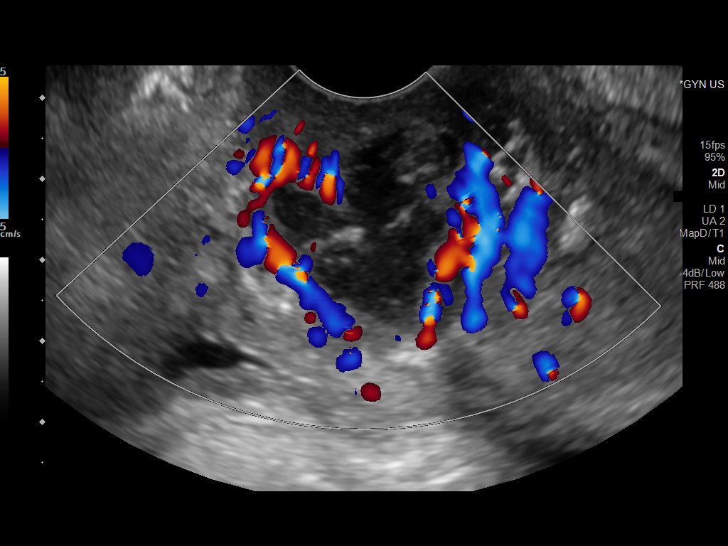

[13 of 25 positions shown; findings below may reference images not displayed]

FINDINGS: Uterus

Measurements: 6.2 x 4.2 x 4.5 cm = volume: 61.3 mL. Uterus is
retroverted. 2.0 x 1.8 x 1.7 cm intramural fibroid present at the
anterior uterus. There is question of a discrete mass at the level
of the cervix measuring approximately 2.7 x 1.5 x 2.1 cm (image 33).

Endometrium

Thickness: 5 mm.  No focal abnormality visualized.

Right ovary

Measurements: 2.4 x 1.7 x 1.9 cm = volume: 4.2 mL. Normal
appearance/no adnexal mass.

Left ovary

Measurements: 3.0 x 1.6 x 1.7 cm = volume: 4.0 mL. Normal
appearance/no adnexal mass.

Pulsed Doppler evaluation of both ovaries demonstrates normal
low-resistance arterial and venous waveforms.

Other findings

Trace free fluid within the pelvis, most likely physiologic.
IMPRESSION: 1. 2.7 x 1.5 x 2.1 cm complex lesion at the level of the cervix,
indeterminate. While this finding could reflect a complex nabothian
cyst and/or cysts, a focal cervical mass could also be considered.
Gynecologic referral for further evaluation and direct visualization
recommended.
2. 2 cm intramural fibroid.
3. Otherwise unremarkable and normal pelvic ultrasound. No evidence
for torsion or other acute finding.

## 2022-04-12 ENCOUNTER — Other Ambulatory Visit: Payer: Self-pay

## 2022-04-12 ENCOUNTER — Emergency Department (HOSPITAL_COMMUNITY)
Admission: EM | Admit: 2022-04-12 | Discharge: 2022-04-13 | Disposition: A | Discharge status: Home / Self Care | Payer: BC Managed Care – PPO | Attending: Emergency Medicine | Admitting: Emergency Medicine

## 2022-04-12 ENCOUNTER — Encounter (HOSPITAL_COMMUNITY): Payer: Self-pay | Admitting: Emergency Medicine

## 2022-04-12 DIAGNOSIS — L299 Pruritus, unspecified: Secondary | ICD-10-CM | POA: Insufficient documentation

## 2022-04-12 DIAGNOSIS — N3001 Acute cystitis with hematuria: Secondary | ICD-10-CM | POA: Insufficient documentation

## 2022-04-13 LAB — URINALYSIS, ROUTINE W REFLEX MICROSCOPIC
Bilirubin Urine: NEGATIVE
Glucose, UA: NEGATIVE mg/dL
Ketones, ur: 5 mg/dL — AB
Nitrite: NEGATIVE
Protein, ur: 100 mg/dL — AB
RBC / HPF: >50 RBC/hpf — ABNORMAL HIGH (ref 0–5)
Specific Gravity, Urine: 1.026 (ref 1.005–1.030)
pH: 5 (ref 5.0–8.0)

## 2022-04-13 LAB — PREGNANCY, URINE: Preg Test, Ur: NEGATIVE

## 2022-04-13 MED ORDER — CEPHALEXIN 500 MG PO CAPS: 500.0000 mg | ORAL_CAPSULE | Freq: Two times a day (BID) | ORAL | 0 refills | Status: AC

## 2022-04-13 MED ORDER — CHLORHEXIDINE GLUCONATE 4 % EX LIQD: Freq: Every day | CUTANEOUS | 0 refills | Status: DC | PRN

## 2022-04-13 NOTE — ED Notes (Signed)
RN attempted to reviewed discharge paperwork w/ pt however she stated "you did nothing to help me."  RN reviewed prescriptions however pt stated, "I'm not taking that, that's not my problem."  RN then reviewed referral documentation however pt stated "I'm not going there, I don't need them, I'm going somewhere they know what they are doing ."  Pt left w/o discharge paperwork.

## 2023-07-09 ENCOUNTER — Ambulatory Visit: Payer: Self-pay | Admitting: Family Medicine

## 2023-07-09 NOTE — Progress Notes (Deleted)
New Patient Office Visit  Subjective    Patient ID: Alicia Lucas, female    DOB: [birth year]  Age: 37 y.o. MRN: [id number]  CC: No chief complaint on file.   HPI Alicia Lucas presents to establish care ***    PMH: ***  PSH: ***  FH: ***  Tobacco use: *** Alcohol use: *** Drug use: *** Marital status: *** Employment: *** Sexual hx: ***  Screenings:  Colon Cancer: *** Lung Cancer: *** Breast Cancer: *** Diabetes: *** HLD: ***   Outpatient Encounter Medications as of 07/09/2023  Medication Sig   AZO-CRANBERRY PO Take 1 tablet by mouth daily as needed (painful urination).   chlorhexidine (HIBICLENS) 4 % external liquid Apply topically daily as needed. May apply to the external aspect of your body 2 times a month, this is not intended for internal use, please dilute with water prior to applying to skin.   dicyclomine (BENTYL) 20 MG tablet Take 1 tablet (20 mg total) by mouth every 8 (eight) hours as needed (cramping).   ibuprofen (ADVIL) 200 MG tablet Take 200 mg by mouth every 6 (six) hours as needed for mild pain.   metroNIDAZOLE (METROGEL) 0.75 % vaginal gel Place vaginally at bedtime.   Multiple Vitamin (MULTIVITAMIN ADULT) TABS Take 1 tablet by mouth daily.   nystatin cream (MYCOSTATIN) Apply 1 application topically daily as needed for dry skin.    ondansetron (ZOFRAN) 4 MG tablet Take 1 tablet (4 mg total) by mouth every 8 (eight) hours as needed for nausea or vomiting. (Patient not taking: Reported on 04/13/2020)   oxyCODONE-acetaminophen (PERCOCET/ROXICET) 5-325 MG tablet Take 1 tablet by mouth every 4 (four) hours as needed for severe pain.   phenazopyridine (PYRIDIUM) 100 MG tablet Take 100 mg by mouth 3 (three) times daily as needed for pain. (Patient not taking: Reported on 04/13/2020)   No facility-administered encounter medications on file as of 07/09/2023.    Past Medical History:  Diagnosis Date   Abnormal Pap smear of cervix     Past Surgical History:   Procedure Laterality Date   LEEP     MOLE REMOVAL      No family history on file.  Social History   Socioeconomic History   Marital status: Single    Spouse name: Not on file   Number of children: Not on file   Years of education: Not on file   Highest education level: Not on file  Occupational History   Not on file  Tobacco Use   Smoking status: Every Day    Current packs/day: 0.50    Types: Cigarettes   Smokeless tobacco: Never  Vaping Use   Vaping status: Never Used  Substance and Sexual Activity   Alcohol use: Yes    Comment: social use   Drug use: No   Sexual activity: Yes    Partners: Male    Birth control/protection: Pill  Other Topics Concern   Not on file  Social History Narrative   Not on file   Social Determinants of Health   Financial Resource Strain: Not on file  Food Insecurity: Not on file  Transportation Needs: Not on file  Physical Activity: Not on file  Stress: Not on file  Social Connections: Not on file  Intimate Partner Violence: Not on file    ROS      Objective    There were no vitals taken for this visit.  Physical Exam     Assessment & Plan:   There  are no diagnoses linked to this encounter.  No follow-ups on file.   Sandre Kitty, MD

## 2023-07-20 ENCOUNTER — Ambulatory Visit (INDEPENDENT_AMBULATORY_CARE_PROVIDER_SITE_OTHER): Payer: Self-pay | Admitting: Family Medicine

## 2023-07-20 ENCOUNTER — Encounter: Payer: Self-pay | Admitting: Family Medicine

## 2023-07-20 VITALS — BP 106/72 | HR 110 | Ht 67.0 in | Wt 125.4 lb

## 2023-07-20 DIAGNOSIS — M2550 Pain in unspecified joint: Secondary | ICD-10-CM

## 2023-07-20 DIAGNOSIS — R5383 Other fatigue: Secondary | ICD-10-CM

## 2023-07-20 DIAGNOSIS — L659 Nonscarring hair loss, unspecified: Secondary | ICD-10-CM

## 2023-07-20 DIAGNOSIS — F1120 Opioid dependence, uncomplicated: Secondary | ICD-10-CM

## 2023-07-20 DIAGNOSIS — K921 Melena: Secondary | ICD-10-CM

## 2023-07-20 DIAGNOSIS — R6889 Other general symptoms and signs: Secondary | ICD-10-CM

## 2023-07-20 DIAGNOSIS — Z114 Encounter for screening for human immunodeficiency virus [HIV]: Secondary | ICD-10-CM

## 2023-07-20 DIAGNOSIS — L219 Seborrheic dermatitis, unspecified: Secondary | ICD-10-CM

## 2023-07-20 MED ORDER — KETOCONAZOLE 2 % EX SHAM: 1.0000 | MEDICATED_SHAMPOO | CUTANEOUS | 1 refills | Status: AC

## 2023-07-20 NOTE — Patient Instructions (Addendum)
It was nice to see you today,  We addressed the following topics today:  -I will send in a prescription for ketoconazole shampoo.  You can use this on the hair and dry flaky skin on your face.  I would like you to use the ketoconazole shampoo daily for 2 weeks and then switch to twice weekly.  After you finish showering you should put on an over-the-counter emollient like CeraVe to avoid drying of the skin. - I would like you to follow up with [place] in 1 month.  Before that I would like you to schedule a lab visit so that we can get lab testing for the various symptoms you have described - You should take MiraLAX regularly with a goal of having a bowel movement that is not hard or difficult to pass every 1 or 2 days. - I would recommend looking into a chronic opioid use clinic for a safer alternative to the hydrocodone you are currently taking   Have a great day,  Frederic Jericho, MD

## 2023-07-20 NOTE — Assessment & Plan Note (Signed)
Patient takes hydrocodon daily that she gets from a a friend or family member.  Does not have a prescription for it.  Takes it to treat a generalized feeling of being unwell.  Does not have any specific pain complaints.  Has a brother who attends a methadone clinic for a history of heroin use.  She denies IV drug use in the past but has endorsed previous opioid use. - Recommended patient establish with a opioid dependence clinic.  Not appropriate for pain clinic as she has no specific pain complaints.

## 2023-07-20 NOTE — Progress Notes (Unsigned)
New Patient Office Visit  Subjective    Patient ID: Alicia Lucas, female    DOB: 1986/07/21  Age: 37 y.o. MRN: [id number]  CC:  Chief Complaint  Patient presents with   New Patient (Initial Visit)    HPI Alicia Lucas presents to establish care  Patient presents today with concerns of various complaints all dating back to last summer.  She has not had a primary care provider prior to this.  No past medical history or surgical history prior to her onset of symptoms last year.  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  Around that time and the summer 2023 she says she was bitten by some flies at her parents house.  Since then she has attributed this to several complaints listed below:  Swollen lymph nodes in the neck-states they come and go.  Not currently having any.  Flaky skin, dried skin, hair loss.-Patient states that her hair falls out in clumps.  States that she has dry scaliness on her forehead, face and hair.  Has used Nizoral shampoo in the past and says this worked when she took it but was expensive.  Runny nose, muscle pain, joint pain, weakness-patient states that the muscle pain is mostly in the legs.  Also comes and goes.  Also onset 1 year ago.  Patient takes hydrocodone that she gets from a friend that she says helps with a variety of her complaints.  She used to use opioids without a prescription in the past.  Has never used IV drugs.  No other drug use.  Currently smokes cigarettes.  No alcohol use currently.  Not since the last year.  Patient also complaining of blurred vision, hot flashes.  States the blurred vision is worse far away.  It is bilateral.  Onset 1 year ago.  Patient complaining of bloody stool.  Describes as bright red blood per rectum.  Has painful bowel movements with straining.  Has a bowel movement every 1 to 2 weeks.  Patient also complaining of hot flashes and  jitteriness that is worse when she is stressed out.  Lying down makes the symptoms better.  Was living with ex boyfriend.  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 Closed covid.    PMH: no hx prior to last year  PSH: no surgeries.    FH: father - afib.   Miller fishcer syndrome.  Father.    Tobacco use: cigarettes - 10 years.  1/2 ppd.   Alcohol use: no alcohol in last year Drug use: hydrocodone.  Marital status: in a relationship.  Female partner.   Employment: unemployed.  Patient previously was working as a Scientist, physiological at a Social worker firm that close during [employer].  After that she worked at [employer] briefly but when she developed symptoms she was let go for tardiness. Sexual hx: sexually active.  No birth control.    Screenings:  Patient unsure when her last Pap smear was.   Outpatient Encounter Medications as of 07/20/2023  Medication Sig   [START ON 07/22/2023] ketoconazole (NIZORAL) 2 % shampoo Apply 1 Application topically 2 (two) times a week.   [DISCONTINUED] AZO-CRANBERRY PO Take 1 tablet by mouth daily as needed (painful urination). (Patient not taking: Reported on 07/20/2023)   [DISCONTINUED] chlorhexidine (HIBICLENS) 4 % external liquid Apply topically daily as needed. May apply to the external aspect of your body 2 times a month, this  is not intended for internal use, please dilute with water prior to applying to skin. (Patient not taking: Reported on 07/20/2023)   [DISCONTINUED] dicyclomine (BENTYL) 20 MG tablet Take 1 tablet (20 mg total) by mouth every 8 (eight) hours as needed (cramping). (Patient not taking: Reported on 07/20/2023)   [DISCONTINUED] ibuprofen (ADVIL) 200 MG tablet Take 200 mg by mouth every 6 (six) hours as needed for mild pain. (Patient not taking: Reported on 07/20/2023)   [DISCONTINUED] metroNIDAZOLE (METROGEL) 0.75 % vaginal gel Place vaginally at bedtime. (Patient not taking: Reported on 07/20/2023)   [DISCONTINUED] Multiple Vitamin (MULTIVITAMIN ADULT) TABS Take 1  tablet by mouth daily. (Patient not taking: Reported on 07/20/2023)   [DISCONTINUED] nystatin cream (MYCOSTATIN) Apply 1 application topically daily as needed for dry skin.  (Patient not taking: Reported on 07/20/2023)   [DISCONTINUED] ondansetron (ZOFRAN) 4 MG tablet Take 1 tablet (4 mg total) by mouth every 8 (eight) hours as needed for nausea or vomiting. (Patient not taking: Reported on 04/13/2020)   [DISCONTINUED] oxyCODONE-acetaminophen (PERCOCET/ROXICET) 5-325 MG tablet Take 1 tablet by mouth every 4 (four) hours as needed for severe pain. (Patient not taking: Reported on 07/20/2023)   [DISCONTINUED] phenazopyridine (PYRIDIUM) 100 MG tablet Take 100 mg by mouth 3 (three) times daily as needed for pain. (Patient not taking: Reported on 04/13/2020)   No facility-administered encounter medications on file as of 07/20/2023.    Past Medical History:  Diagnosis Date   Abnormal Pap smear of cervix     Past Surgical History:  Procedure Laterality Date   LEEP     MOLE REMOVAL      Family History  Problem Relation Age of Onset   Heart disease Father     Social History   Socioeconomic History   Marital status: Single    Spouse name: Not on file   Number of children: Not on file   Years of education: Not on file   Highest education level: Not on file  Occupational History   Not on file  Tobacco Use   Smoking status: Every Day    Current packs/day: 0.50    Types: Cigarettes   Smokeless tobacco: Never  Vaping Use   Vaping status: Never Used  Substance and Sexual Activity   Alcohol use: Yes    Comment: social use   Drug use: No   Sexual activity: Yes    Partners: Male    Birth control/protection: Pill  Other Topics Concern   Not on file  Social History Narrative   Not on file   Social Determinants of Health   Financial Resource Strain: Not on file  Food Insecurity: Not on file  Transportation Needs: Not on file  Physical Activity: Not on file  Stress: Not on file   Social Connections: Not on file  Intimate Partner Violence: Not on file    ROS      Objective    BP 106/72   Pulse (!) 110   Ht 5\' 7"  (1.702 m)   Wt 125 lb 6.4 oz (56.9 kg)   LMP 07/18/2023   SpO2 100%   BMI 19.64 kg/m   Physical Exam General: Alert, oriented HEENT: PERRLA, EOMI, moist oral mucosa Neck: No lymphadenopathy, no thyromegaly, no abnormalities seen CV: Regular rate and rhythm no murmurs Pulmonary: Lungs clear bilaterally no wheeze or crackles GI: Soft, minimally tender in the suprapubic region.  Normal bowel sounds MSK: Strength equal bilaterally, normal gait Extremities: No pedal edema Skin: No obvious localized hair  loss.  No dryness or flakiness of the skin noticed.  Does have onychomycosis of multiple toenails on the left foot. Psych: Anxious appearing.  Maintains good eye contact, spontaneous speech.     Assessment & Plan:   Uncomplicated opioid dependence [facility]) Assessment & Plan: Patient takes hydrocodon daily that she gets from a a friend or family member.  Does not have a prescription for it.  Takes it to treat a generalized feeling of being unwell.  Does not have any specific pain complaints.  Has a brother who attends a methadone clinic for a history of heroin use.  She denies IV drug use in the past but has endorsed previous opioid use. - Recommended patient establish with a opioid dependence clinic.  Not appropriate for pain clinic as she has no specific pain complaints.   Other orders -     Ketoconazole; Apply 1 Application topically 2 (two) times a week.  Dispense: 120 mL; Refill: 1    Return for Variety of issues.   Sandre Kitty, MD

## 2023-07-21 ENCOUNTER — Other Ambulatory Visit: Payer: Self-pay

## 2023-07-21 DIAGNOSIS — L659 Nonscarring hair loss, unspecified: Secondary | ICD-10-CM | POA: Insufficient documentation

## 2023-07-21 DIAGNOSIS — L219 Seborrheic dermatitis, unspecified: Secondary | ICD-10-CM | POA: Insufficient documentation

## 2023-07-21 DIAGNOSIS — R6889 Other general symptoms and signs: Secondary | ICD-10-CM | POA: Insufficient documentation

## 2023-07-21 DIAGNOSIS — K921 Melena: Secondary | ICD-10-CM | POA: Insufficient documentation

## 2023-07-21 NOTE — Assessment & Plan Note (Signed)
Prescribed ketoconazole 2% shampoo to use on face and hair.  Initially once daily for 1 week then twice a week thereafter.

## 2023-07-21 NOTE — Assessment & Plan Note (Addendum)
Patient has several complaints that do not appear to be initially associated with each other all originating around the same timeframe.  These include blurred vision, fatigue, joint and muscle pain, hair loss, jitteriness. - CBC, CMP, TSH, A1c, lipid profile - ANA, ESR, CRP - HIV, RPR - Following up in 1 month

## 2023-07-21 NOTE — Assessment & Plan Note (Signed)
On exam there does not appear to be any significant alopecia.  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

## 2023-07-21 NOTE — Assessment & Plan Note (Signed)
Likely due to opioid use causing constipation resulting in hemorrhoids.  Advised patient to use MiraLAX as needed with goal of bowel movement every 1 to 2 days without straining.  Recommended patient reach out to opioid treatment center.

## 2023-07-22 ENCOUNTER — Other Ambulatory Visit: Payer: Self-pay

## 2023-08-23 ENCOUNTER — Ambulatory Visit: Payer: Self-pay | Admitting: Family Medicine

## 2023-08-23 NOTE — Progress Notes (Deleted)
   Established Patient Office Visit  Subjective   Patient ID: Tylah Mancillas, female    DOB: 1986/09/23  Age: 37 y.o. MRN: [id number]  No chief complaint on file.   HPI  Her symptoms-swollen lymph nodes, runny nose, muscle ache, joint pain, weakness, blurred vision, constipation, hot flashes, jitteriness.  Symptoms coincided with loss of job and boyfriend and moving in with parents.  Need to get her future labs.  Why were none of these labs done?  Why does it say collected  Opioid dependence clinic?   The ASCVD Risk score (Arnett DK, et al., 2019) failed to calculate for the following reasons:   The 2019 ASCVD risk score is only valid for ages 60 to 47  Health Maintenance Due  Topic Date Due   HIV Screening  Never done   Hepatitis C Screening  Never done   Cervical Cancer Screening (HPV/Pap Cotest)  Never done   DTaP/Tdap/Td (3 - Td or Tdap) 11/30/2022   INFLUENZA VACCINE  Never done   COVID-19 Vaccine (1 - 2023-24 season) Never done      Objective:     There were no vitals taken for this visit. {Vitals History (Optional):23777}  Physical Exam   No results found for any visits on 08/23/23.      Assessment & Plan:   There are no diagnoses linked to this encounter.   No follow-ups on file.    Sandre Kitty, MD

## 2023-08-24 ENCOUNTER — Ambulatory Visit: Payer: Self-pay | Admitting: Family Medicine

## 2024-01-19 ENCOUNTER — Other Ambulatory Visit

## 2024-01-19 DIAGNOSIS — M2550 Pain in unspecified joint: Secondary | ICD-10-CM

## 2024-01-19 DIAGNOSIS — Z114 Encounter for screening for human immunodeficiency virus [HIV]: Secondary | ICD-10-CM

## 2024-01-19 DIAGNOSIS — L659 Nonscarring hair loss, unspecified: Secondary | ICD-10-CM

## 2024-01-19 DIAGNOSIS — R5383 Other fatigue: Secondary | ICD-10-CM

## 2024-02-07 ENCOUNTER — Other Ambulatory Visit

## 2024-02-08 ENCOUNTER — Other Ambulatory Visit

## 2024-02-10 LAB — TSH: TSH: 1.75 u[IU]/mL (ref 0.450–4.500)

## 2024-02-10 LAB — ANA W/REFLEX IF POSITIVE
Anti JO-1: <0.2 AI (ref 0.0–0.9)
Anti Nuclear Antibody (ANA): POSITIVE — AB
Centromere Ab Screen: <0.2 AI (ref 0.0–0.9)
Chromatin Ab SerPl-aCnc: <0.2 AI (ref 0.0–0.9)
ENA RNP Ab: 1.4 AI — ABNORMAL HIGH (ref 0.0–0.9)
ENA SM Ab Ser-aCnc: <0.2 AI (ref 0.0–0.9)
ENA SSA (RO) Ab: <0.2 AI (ref 0.0–0.9)
ENA SSB (LA) Ab: <0.2 AI (ref 0.0–0.9)
Scleroderma (Scl-70) (ENA) Antibody, IgG: <0.2 AI (ref 0.0–0.9)
dsDNA Ab: 1 [IU]/mL (ref 0–9)

## 2024-02-10 LAB — C-REACTIVE PROTEIN: CRP: 1 mg/L (ref 0–10)

## 2024-02-10 LAB — RPR: RPR Ser Ql: NONREACTIVE

## 2024-02-10 LAB — SEDIMENTATION RATE: Sed Rate: 4 mm/h (ref 0–32)

## 2024-02-10 LAB — HIV ANTIBODY (ROUTINE TESTING W REFLEX)

## 2024-02-14 ENCOUNTER — Ambulatory Visit: Admitting: Family Medicine

## 2024-02-14 ENCOUNTER — Encounter: Payer: Self-pay | Admitting: Family Medicine

## 2024-02-14 VITALS — BP 131/84 | HR 100 | Ht 67.0 in | Wt 142.1 lb

## 2024-02-14 DIAGNOSIS — F411 Generalized anxiety disorder: Secondary | ICD-10-CM

## 2024-02-14 DIAGNOSIS — L219 Seborrheic dermatitis, unspecified: Secondary | ICD-10-CM

## 2024-02-14 DIAGNOSIS — K5900 Constipation, unspecified: Secondary | ICD-10-CM

## 2024-02-14 DIAGNOSIS — F4521 Hypochondriasis: Secondary | ICD-10-CM | POA: Diagnosis not present

## 2024-02-14 DIAGNOSIS — L659 Nonscarring hair loss, unspecified: Secondary | ICD-10-CM

## 2024-02-14 MED ORDER — CITALOPRAM HYDROBROMIDE 10 MG PO TABS: 10.0000 mg | ORAL_TABLET | Freq: Every day | ORAL | 3 refills | Status: AC

## 2024-02-14 NOTE — Progress Notes (Unsigned)
   Established Patient Office Visit  Subjective   Patient ID: Roby Mckinzey, female    DOB: Apr 06, 1986  Age: 38 y.o. MRN: [id number]  Chief Complaint  Patient presents with   Medical Management of Chronic Issues    HPI Subjective: - Reports anxiety, stress about everyday life, getting places on time - Skin issues: scalp with grayish consistency, lips peeling daily, hard pieces of skin on face - Hair loss - Weakness, joint cracking/popping - Blurred vision (worse for far away objects) - Bright red blood in stool - Bowel movements vary from daily to once every couple weeks - Hot flashes, dizziness - Not currently working - Father had Guillain-Barre syndrome - Moved in with parents when symptoms started - reports upstairs area is dusty, moldy, dirty - Shortness of breath with minimal exertion - Ankle/foot swelling  Medications: - Using prescribed medicated shampoo (reports it helps somewhat)    The ASCVD Risk score (Arnett DK, et al., 2019) failed to calculate for the following reasons:   The 2019 ASCVD risk score is only valid for ages 46 to 24  Health Maintenance Due  Topic Date Due   Hepatitis C Screening  Never done   Pneumococcal Vaccine 4-33 Years old (1 of 2 - PCV) Never done   Cervical Cancer Screening (HPV/Pap Cotest)  Never done   DTaP/Tdap/Td (3 - Td or Tdap) 11/30/2022   COVID-19 Vaccine (1 - 2024-25 season) Never done      Objective:     BP 131/84   Pulse 100   Ht 5\' 7"  (1.702 m)   Wt 142 lb 1.9 oz (64.5 kg)   LMP 02/14/2024   SpO2 99%   BMI 22.26 kg/m  {Vitals History (Optional):23777}  Physical Exam   No results found for any visits on 02/14/24.      Assessment & Plan:   There are no diagnoses linked to this encounter.   No follow-ups on file.    Laneta Pintos, MD

## 2024-02-14 NOTE — Patient Instructions (Signed)
 It was nice to see you today,  We addressed the following topics today: -I am sending in a prescription for citalopram 10 mg.  Take this once a day.  You can take it at night if you prefer.  I provided some more information regarding this medicine in the handout. - Please schedule a lab visit for additional testing. - I will send in a referral to the therapist.  You should hear from somebody in the next 4 weeks.  [facility] [place] provides low-cost therapy and psychiatric medication management for anybody regardless of insurance.  They are no other option if you need to see somebody sooner.   Have a great day,  Etha Henle, MD

## 2024-02-16 ENCOUNTER — Other Ambulatory Visit: Payer: Self-pay | Admitting: *Deleted

## 2024-02-16 DIAGNOSIS — K921 Melena: Secondary | ICD-10-CM

## 2024-02-16 DIAGNOSIS — R5383 Other fatigue: Secondary | ICD-10-CM

## 2024-02-16 DIAGNOSIS — K5900 Constipation, unspecified: Secondary | ICD-10-CM | POA: Insufficient documentation

## 2024-02-16 DIAGNOSIS — F4521 Hypochondriasis: Secondary | ICD-10-CM | POA: Insufficient documentation

## 2024-02-16 DIAGNOSIS — Z720 Tobacco use: Secondary | ICD-10-CM

## 2024-02-16 NOTE — Assessment & Plan Note (Signed)
 Patient bowel movements vary from every 1 to 2 days to 1 to 2 weeks.  Abdominal pain, rectal pain, blood in stool worsened with more prolonged episodes of constipation.  Recommended use of MiraLAX and other stool softeners.  Discussed goal of bowel movement every 1 to 2 days.

## 2024-02-16 NOTE — Assessment & Plan Note (Signed)
 Patient concerned with multiple different symptoms ranging from shortness of breath, leg swelling, skin complaints, neurologic complaints.  Fixates on parasites/mold as a possible cause of her symptoms.  Lab testing has been normal.  Exam has been grossly normal.  Patient agreeable to starting citalopram and seeing a therapist.

## 2024-02-16 NOTE — Assessment & Plan Note (Signed)
 She continues to use the ketoconazole  lotion.  Her skin complaints on exam are not as severe as her complaints of them.  General the location she is referring to show mild comedones.

## 2024-02-16 NOTE — Assessment & Plan Note (Signed)
 Complains of hair loss but on exam there is no significant alopecia noted.

## 2024-02-17 ENCOUNTER — Other Ambulatory Visit

## 2024-02-22 ENCOUNTER — Other Ambulatory Visit

## 2024-02-22 DIAGNOSIS — K921 Melena: Secondary | ICD-10-CM

## 2024-02-22 DIAGNOSIS — R5383 Other fatigue: Secondary | ICD-10-CM

## 2024-02-22 DIAGNOSIS — Z720 Tobacco use: Secondary | ICD-10-CM

## 2024-03-15 ENCOUNTER — Ambulatory Visit: Admitting: Family Medicine

## 2024-03-31 ENCOUNTER — Other Ambulatory Visit: Payer: Self-pay | Admitting: Family Medicine

## 2024-03-31 DIAGNOSIS — R768 Other specified abnormal immunological findings in serum: Secondary | ICD-10-CM

## 2024-03-31 DIAGNOSIS — R2990 Unspecified symptoms and signs involving the nervous system: Secondary | ICD-10-CM

## 2024-03-31 DIAGNOSIS — R5383 Other fatigue: Secondary | ICD-10-CM

## 2024-05-25 ENCOUNTER — Ambulatory Visit: Admitting: Family Medicine

## 2024-09-12 NOTE — ED Notes (Signed)
 Attempted calling pt about antibiotic. Phone not in service.

## 2024-11-20 NOTE — Progress Notes (Unsigned)
 "  Office Visit Note  Patient: Alicia Lucas             Date of Birth: Jan 13, 1986           MRN: [id number]             PCP: Chandra Toribio POUR, MD Referring: Chandra Toribio POUR, MD Visit Date: 12/04/2024 Occupation: Data Unavailable  Subjective:  No chief complaint on file.   History of Present Illness: Alicia Lucas is a 39 y.o. female ***     Activities of Daily Living:  Patient reports morning stiffness for *** {minute/hour:19697}.   Patient {ACTIONS;DENIES/REPORTS:21021675::Denies} nocturnal pain.  Difficulty dressing/grooming: {ACTIONS;DENIES/REPORTS:21021675::Denies} Difficulty climbing stairs: {ACTIONS;DENIES/REPORTS:21021675::Denies} Difficulty getting out of chair: {ACTIONS;DENIES/REPORTS:21021675::Denies} Difficulty using hands for taps, buttons, cutlery, and/or writing: {ACTIONS;DENIES/REPORTS:21021675::Denies}  No Rheumatology ROS completed.   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   Seborrheic dermatitis 07/21/2023   Blood in stool 07/21/2023   Hair loss 07/21/2023   Multiple complaints 07/21/2023   Opioid dependence (HCC) 07/20/2023   Tobacco abuse 03/24/2017   Tobacco abuse counseling 03/24/2017   Abnormal cervical Papanicolaou smear 03/24/2017   Counseling on health promotion and disease prevention 03/24/2017   Fatigue 03/24/2017    Past Medical History:  Diagnosis Date   Abnormal Pap smear of cervix     Family History  Problem Relation Age of Onset   Heart disease Father    Past Surgical History:  Procedure Laterality Date   LEEP     MOLE REMOVAL     Social History[1] Social History   Social History Narrative   Not on file     Immunization History  Administered Date(s) Administered   Tdap 07/05/2012, 11/30/2012     Objective: Vital Signs: There were no vitals taken for this visit.   Physical Exam   Musculoskeletal Exam: ***  CDAI  Exam: CDAI Score: -- Patient Global: --; Provider Global: -- Swollen: --; Tender: -- Joint Exam 12/04/2024   No joint exam has been documented for this visit   There is currently no information documented on the homunculus. Go to the Rheumatology activity and complete the homunculus joint exam.  Investigation: No additional findings.  Imaging: No results found.  Recent Labs: Lab Results  Component Value Date   WBC 12.6 (H) 03/07/2019   HGB 13.5 03/07/2019   PLT 192 03/07/2019   NA 140 03/07/2019   K 3.7 03/07/2019   CL 108 03/07/2019   CO2 26 03/07/2019   GLUCOSE 101 (H) 03/07/2019   BUN 8 03/07/2019   CREATININE 0.62 03/07/2019   BILITOT 0.7 03/07/2019   ALKPHOS 63 03/07/2019   AST 16 03/07/2019   ALT 14 03/07/2019   PROT 7.4 03/07/2019   ALBUMIN 3.7 03/07/2019   CALCIUM 8.8 (L) 03/07/2019   GFRAA >60 03/07/2019   February 08, 2024 ANA positive, RNP 1.4, (dsDNA, Smith, SCL 70, SSA, SSB, chromatin, Jo 1, centromere negative), sed rate 4, CRP 1, TSH normal, HIV nonreactive  Speciality Comments: No specialty comments available.  Procedures:  No procedures performed Allergies: Patient has no known allergies.   Assessment / Plan:     Visit Diagnoses: No diagnosis found.  Orders: No orders of the defined types were placed in this encounter.  No orders of the defined types were placed in this encounter.   Face-to-face time spent with patient was *** minutes. Greater than 50% of time was spent in  counseling and coordination of care.  Follow-Up Instructions: No follow-ups on file.   Maya Nash, MD  Note - This record has been created using Animal nutritionist.  Chart creation errors have been sought, but may not always  have been located. Such creation errors do not reflect on  the standard of medical care.    [1]  Social History Tobacco Use   Smoking status: Every Day    Current packs/day: 0.50    Types: Cigarettes   Smokeless tobacco: Never  Vaping  Use   Vaping status: Never Used  Substance Use Topics   Alcohol use: Yes    Comment: social use   Drug use: No   "

## 2024-12-04 ENCOUNTER — Encounter: Admitting: Rheumatology

## 2024-12-04 DIAGNOSIS — Z720 Tobacco use: Secondary | ICD-10-CM

## 2024-12-04 DIAGNOSIS — L219 Seborrheic dermatitis, unspecified: Secondary | ICD-10-CM

## 2024-12-04 DIAGNOSIS — F419 Anxiety disorder, unspecified: Secondary | ICD-10-CM

## 2024-12-04 DIAGNOSIS — R5383 Other fatigue: Secondary | ICD-10-CM

## 2024-12-04 DIAGNOSIS — R7689 Other specified abnormal immunological findings in serum: Secondary | ICD-10-CM

## 2024-12-04 DIAGNOSIS — F1120 Opioid dependence, uncomplicated: Secondary | ICD-10-CM

## 2024-12-04 DIAGNOSIS — K5909 Other constipation: Secondary | ICD-10-CM

## 2024-12-04 DIAGNOSIS — L659 Nonscarring hair loss, unspecified: Secondary | ICD-10-CM

## 2025-01-02 ENCOUNTER — Ambulatory Visit: Admitting: Rheumatology
